# Patient Record
Sex: Female | Born: 1990 | Race: Black or African American | Hispanic: No | Marital: Single | Smoking: Never smoker
Health system: Southern US, Community
[De-identification: ages and names within clinical notes are randomized; demographics above are authoritative.]

## PROBLEM LIST (undated history)

## (undated) DIAGNOSIS — J45909 Unspecified asthma, uncomplicated: Secondary | ICD-10-CM

## (undated) DIAGNOSIS — L309 Dermatitis, unspecified: Secondary | ICD-10-CM

## (undated) HISTORY — PX: TONSILLECTOMY: SUR1361

## (undated) HISTORY — DX: Dermatitis, unspecified: L30.9

---

## 2010-07-26 ENCOUNTER — Emergency Department (HOSPITAL_BASED_OUTPATIENT_CLINIC_OR_DEPARTMENT_OTHER)
Admission: EM | Admit: 2010-07-26 | Discharge: 2010-07-26 | Disposition: A | Discharge status: Home / Self Care | Payer: Self-pay | Attending: Emergency Medicine | Admitting: Emergency Medicine

## 2010-07-26 DIAGNOSIS — N390 Urinary tract infection, site not specified: Secondary | ICD-10-CM | POA: Insufficient documentation

## 2010-07-26 DIAGNOSIS — R319 Hematuria, unspecified: Secondary | ICD-10-CM | POA: Insufficient documentation

## 2010-07-26 LAB — URINALYSIS, ROUTINE W REFLEX MICROSCOPIC
Bilirubin Urine: NEGATIVE
Nitrite: NEGATIVE
Specific Gravity, Urine: 1.027 (ref 1.005–1.030)
Urobilinogen, UA: 0.2 mg/dL (ref 0.0–1.0)

## 2010-07-26 LAB — PREGNANCY, URINE: Preg Test, Ur: NEGATIVE

## 2010-07-26 LAB — URINE MICROSCOPIC-ADD ON

## 2010-07-29 LAB — URINE CULTURE
Colony Count: >=100000
Culture  Setup Time: 201206260142

## 2013-10-15 ENCOUNTER — Emergency Department (HOSPITAL_BASED_OUTPATIENT_CLINIC_OR_DEPARTMENT_OTHER)
Admission: EM | Admit: 2013-10-15 | Discharge: 2013-10-15 | Disposition: A | Discharge status: Home / Self Care | Payer: Self-pay | Attending: Emergency Medicine | Admitting: Emergency Medicine

## 2013-10-15 ENCOUNTER — Encounter (HOSPITAL_BASED_OUTPATIENT_CLINIC_OR_DEPARTMENT_OTHER): Payer: Self-pay | Admitting: Emergency Medicine

## 2013-10-15 DIAGNOSIS — R350 Frequency of micturition: Secondary | ICD-10-CM | POA: Insufficient documentation

## 2013-10-15 DIAGNOSIS — R300 Dysuria: Secondary | ICD-10-CM | POA: Insufficient documentation

## 2013-10-15 DIAGNOSIS — R3915 Urgency of urination: Secondary | ICD-10-CM | POA: Insufficient documentation

## 2013-10-15 DIAGNOSIS — Z3202 Encounter for pregnancy test, result negative: Secondary | ICD-10-CM | POA: Insufficient documentation

## 2013-10-15 LAB — URINALYSIS, ROUTINE W REFLEX MICROSCOPIC
Bilirubin Urine: NEGATIVE
GLUCOSE, UA: NEGATIVE mg/dL
HGB URINE DIPSTICK: NEGATIVE
Ketones, ur: NEGATIVE mg/dL
LEUKOCYTES UA: NEGATIVE
Nitrite: NEGATIVE
Protein, ur: NEGATIVE mg/dL
SPECIFIC GRAVITY, URINE: 1.034 — AB (ref 1.005–1.030)
UROBILINOGEN UA: 1 mg/dL (ref 0.0–1.0)
pH: 6 (ref 5.0–8.0)

## 2013-10-15 LAB — PREGNANCY, URINE: PREG TEST UR: NEGATIVE

## 2013-10-15 MED ORDER — PHENAZOPYRIDINE HCL 200 MG PO TABS: 200.0000 mg | ORAL_TABLET | Freq: Three times a day (TID) | ORAL | Status: DC

## 2013-10-15 MED ORDER — SULFAMETHOXAZOLE-TRIMETHOPRIM 800-160 MG PO TABS: 2.0000 | ORAL_TABLET | Freq: Two times a day (BID) | ORAL | Status: DC

## 2013-10-15 NOTE — ED Notes (Addendum)
Urinary frequency. Dysuria last night. She was started on antibiotics for UTI on Friday.

## 2013-10-15 NOTE — ED Provider Notes (Signed)
CSN: [id number]     Arrival date & time 10/15/13  1922 History  This chart was scribed for Theresa Chick, MD by Roxy Cedar, ED Scribe. This patient was seen in room MH01/MH01 and the patient's care was started at 9:57 PM.  Chief Complaint  Patient presents with  . Urinary Tract Infection   Patient is a 23 y.o. female presenting with urinary tract infection. The history is provided by the patient. No language interpreter was used.  Urinary Tract Infection This is a recurrent problem. The current episode started yesterday. The problem occurs constantly. The problem has been gradually worsening. Pertinent negatives include no chest pain, no abdominal pain, no headaches and no shortness of breath. Exacerbated by: bactrim antibiotic. Nothing relieves the symptoms. Treatments tried: bactrim antibiotic.   HPI Comments: Carlton Jon is a 23 y.o. female who presents to the Emergency Department complaining of urinary frequency, urgency and dysuria that began last night. Patient states that she was taking Bactrim antibiotic 2 tablets BID x 3 days. She states that she has completed her dose and that her symptoms have worsened since she started taking the antibiotic medication. Patient denies associated fever or vomiting.  No abdominal pain,  No vaginal discharge.  She states that 3 days ago she had pelvic exam and complete STD screen as well.    History reviewed. No pertinent past medical history. Past Surgical History  Procedure Laterality Date  . Tonsillectomy     No family history on file. History  Substance Use Topics  . Smoking status: Never Smoker   . Smokeless tobacco: Not on file  . Alcohol Use: No   OB History   Grav Para Term Preterm Abortions TAB SAB Ect Mult Living                 Review of Systems  Constitutional: Negative for fever.  Respiratory: Negative for shortness of breath.   Cardiovascular: Negative for chest pain.  Gastrointestinal: Negative for vomiting and  abdominal pain.  Genitourinary: Positive for dysuria, urgency and frequency.  Neurological: Negative for headaches.  All other systems reviewed and are negative.  Allergies  Review of patient's allergies indicates no known allergies.  Home Medications   Prior to Admission medications   Medication Sig Start Date End Date Taking? Authorizing Provider  phenazopyridine (PYRIDIUM) 200 MG tablet Take 1 tablet (200 mg total) by mouth 3 (three) times daily. 10/15/13   Theresa Chick, MD  sulfamethoxazole-trimethoprim (SEPTRA DS) 800-160 MG per tablet Take 2 tablets by mouth 2 (two) times daily. 10/15/13   Theresa Chick, MD   Triage Vitals: BP 111/79  Pulse 89  Temp(Src) 98.9 F (37.2 C) (Oral)  Resp 20  Ht  (1.626 m)  Wt 147 lb (66.679 kg)  BMI 25.22 kg/m2  SpO2 100%  LMP 08/14/2013  Physical Exam  Nursing note and vitals reviewed. Constitutional: She is oriented to person, place, and time. She appears well-developed and well-nourished. No distress.  HENT:  Head: Normocephalic and atraumatic.  Eyes: Conjunctivae and EOM are normal.  Neck: Neck supple. No tracheal deviation present.  Cardiovascular: Normal rate.   Pulmonary/Chest: Effort normal. No respiratory distress.  Abdominal: Soft. She exhibits no distension. There is no tenderness.  Musculoskeletal: Normal range of motion.  Neurological: She is alert and oriented to person, place, and time.  Skin: Skin is warm and dry.  Psychiatric: She has a normal mood and affect. Her behavior is normal.  Note- abdomen soft, nabs, MMM  ED Course  Procedures (including critical care time)  DIAGNOSTIC STUDIES: Oxygen Saturation is 100% on RA, normal by my interpretation.    COORDINATION OF CARE: 10:02 PM- Discussed plans to order diagnostic lab work. Will give patient Septra and Pyridium and will discharge. Pt advised of plan for treatment and pt agrees.  Labs Review Labs Reviewed  URINALYSIS, ROUTINE W REFLEX MICROSCOPIC -  Abnormal; Notable for the following:    Specific Gravity, Urine 1.034 (*)    All other components within normal limits  URINE CULTURE  PREGNANCY, URINE   Imaging Review No results found.   EKG Interpretation None     MDM   Final diagnoses:  Dysuria    Pt presenting with continued dysuria- finished 3 days of bactrim.  Urinalysis is reassuring. Abdomen is nontender.  Pt states she had STD screen perfomed several days ago.  Will continue on bactrim as urine seems to have cleared, given rx for pyridium for continued symptoms.  Urine culture sent as well.  No abdominal or pelvic pain to suggest PID or other acute emergent condition.  Discharged with strict return precautions.  Pt agreeable with plan.  I personally performed the services described in this documentation, which was scribed in my presence. The recorded information has been reviewed and is accurate.  Nursing notes including past medical history and social history reviewed and considered in documentation Prior records reviewed and considered during this visit     Theresa Chick, MD 10/15/13 2307

## 2014-09-12 ENCOUNTER — Encounter (HOSPITAL_BASED_OUTPATIENT_CLINIC_OR_DEPARTMENT_OTHER): Payer: Self-pay | Admitting: *Deleted

## 2014-09-12 ENCOUNTER — Emergency Department (HOSPITAL_BASED_OUTPATIENT_CLINIC_OR_DEPARTMENT_OTHER)
Admission: EM | Admit: 2014-09-12 | Discharge: 2014-09-12 | Disposition: A | Discharge status: Home / Self Care | Payer: BLUE CROSS/BLUE SHIELD | Attending: Emergency Medicine | Admitting: Emergency Medicine

## 2014-09-12 DIAGNOSIS — N760 Acute vaginitis: Secondary | ICD-10-CM | POA: Insufficient documentation

## 2014-09-12 DIAGNOSIS — Z3202 Encounter for pregnancy test, result negative: Secondary | ICD-10-CM | POA: Diagnosis not present

## 2014-09-12 DIAGNOSIS — Z79899 Other long term (current) drug therapy: Secondary | ICD-10-CM | POA: Insufficient documentation

## 2014-09-12 DIAGNOSIS — B9689 Other specified bacterial agents as the cause of diseases classified elsewhere: Secondary | ICD-10-CM

## 2014-09-12 DIAGNOSIS — Z792 Long term (current) use of antibiotics: Secondary | ICD-10-CM | POA: Insufficient documentation

## 2014-09-12 DIAGNOSIS — R102 Pelvic and perineal pain: Secondary | ICD-10-CM

## 2014-09-12 LAB — URINALYSIS, ROUTINE W REFLEX MICROSCOPIC
BILIRUBIN URINE: NEGATIVE
Glucose, UA: NEGATIVE mg/dL
Hgb urine dipstick: NEGATIVE
KETONES UR: NEGATIVE mg/dL
NITRITE: NEGATIVE
PROTEIN: NEGATIVE mg/dL
Specific Gravity, Urine: 1.036 — ABNORMAL HIGH (ref 1.005–1.030)
UROBILINOGEN UA: 1 mg/dL (ref 0.0–1.0)
pH: 6 (ref 5.0–8.0)

## 2014-09-12 LAB — WET PREP, GENITAL
Trich, Wet Prep: NONE SEEN
Yeast Wet Prep HPF POC: NONE SEEN

## 2014-09-12 LAB — PREGNANCY, URINE: Preg Test, Ur: NEGATIVE

## 2014-09-12 LAB — URINE MICROSCOPIC-ADD ON

## 2014-09-12 MED ORDER — METRONIDAZOLE 500 MG PO TABS: 500.0000 mg | ORAL_TABLET | Freq: Two times a day (BID) | ORAL | Status: DC

## 2014-09-12 MED ORDER — NAPROXEN 375 MG PO TABS: 375.0000 mg | ORAL_TABLET | Freq: Two times a day (BID) | ORAL | Status: DC

## 2014-09-12 NOTE — ED Provider Notes (Signed)
CSN: [id number]     Arrival date & time 09/12/14  1708 History   First MD Initiated Contact with Patient 09/12/14 1833     Chief Complaint  Patient presents with  . Abdominal Pain     (Consider location/radiation/quality/duration/timing/severity/associated sxs/prior Treatment) HPI   Theresa Le The 24 year old female who presents emergency Department with chief complaint of pelvic pain. Patient states she's had 4 days of worsening crampy pelvic abdominal pain. She does have associated clear vaginal discharge without foul odor. She denies urinary symptoms, fever, constipation. Patient's last menstrual period was last month. However, she states her periods are very irregular. When she does have them. She has severe cramps and heavy bleeding. Patient is sexually active with a single female partner.   History reviewed. No pertinent past medical history. Past Surgical History  Procedure Laterality Date  . Tonsillectomy     No family history on file. Social History  Substance Use Topics  . Smoking status: Never Smoker   . Smokeless tobacco: None  . Alcohol Use: No   OB History    No data available     Review of Systems  Ten systems reviewed and are negative for acute change, except as noted in the HPI.    Allergies  Review of patient's allergies indicates no known allergies.  Home Medications   Prior to Admission medications   Medication Sig Start Date End Date Taking? Authorizing Provider  phenazopyridine (PYRIDIUM) 200 MG tablet Take 1 tablet (200 mg total) by mouth 3 (three) times daily. 10/15/13   Jerelyn Scott, MD  sulfamethoxazole-trimethoprim (SEPTRA DS) 800-160 MG per tablet Take 2 tablets by mouth 2 (two) times daily. 10/15/13   Jerelyn Scott, MD   BP 115/64 mmHg  Pulse 88  Temp(Src) 98.5 F (36.9 C) (Oral)  Resp 16  Ht 5\' 6"  (1.676 m)  Wt 151 lb (68.493 kg)  BMI 24.38 kg/m2  SpO2 100%  LMP 08/06/2014 Physical Exam  Constitutional: She is oriented to person,  place, and time. She appears well-developed and well-nourished. No distress.  HENT:  Head: Normocephalic and atraumatic.  Eyes: Conjunctivae are normal. No scleral icterus.  Neck: Normal range of motion.  Cardiovascular: Normal rate, regular rhythm and normal heart sounds.  Exam reveals no gallop and no friction rub.   No murmur heard. Pulmonary/Chest: Effort normal and breath sounds normal. No respiratory distress.  Abdominal: Soft. Bowel sounds are normal. She exhibits no distension and no mass. There is no tenderness. There is no guarding.  Genitourinary:  Pelvic exam: normal external genitalia, vulva, vagina, cervix, uterus and adnexa. Diffuse tenderness throughout exam. No adnexal tenderness, specifically no cervical motion tenderness, specifically   Neurological: She is alert and oriented to person, place, and time.  Skin: Skin is warm and dry. She is not diaphoretic.  Nursing note and vitals reviewed.   ED Course  Procedures (including critical care time) Labs Review Labs Reviewed  URINALYSIS, ROUTINE W REFLEX MICROSCOPIC (NOT AT [facility]) - Abnormal; Notable for the following:    APPearance CLOUDY (*)    Specific Gravity, Urine 1.036 (*)    Leukocytes, UA SMALL (*)    All other components within normal limits  URINE MICROSCOPIC-ADD ON - Abnormal; Notable for the following:    Squamous Epithelial / LPF FEW (*)    All other components within normal limits  PREGNANCY, URINE    Imaging Review No results found. Lucila Maine, personally reviewed and evaluated these images and lab results as part of my  medical decision-making.   EKG Interpretation None      MDM   Final diagnoses:  None    Patient here with pelvic pain. Urine does not appear to be infected. She has a history of previous urinary tract infections.  Wet prep shows many clue cells and white or red cells.  Patient be treated for bacterial vaginosis. I doubt any other cause of her pain. She is to  follow-up with the [facility] outpatient clinic. Otherwise, she appears well. He is to return precautions.    Arthor Captain, PA-C 09/12/14 1955  Doug Sou, MD 09/12/14 [phone]

## 2014-09-12 NOTE — ED Notes (Signed)
Abdominal pain x 3 days

## 2014-09-12 NOTE — Discharge Instructions (Signed)
Do not drink alcohol with this medication  Pelvic Pain Female pelvic pain can be caused by many different things and start from a variety of places. Pelvic pain refers to pain that is located in the lower half of the abdomen and between your hips. The pain may occur over a short period of time (acute) or may be reoccurring (chronic). The cause of pelvic pain may be related to disorders affecting the female reproductive organs (gynecologic), but it may also be related to the bladder, kidney stones, an intestinal complication, or muscle or skeletal problems. Getting help right away for pelvic pain is important, especially if there has been severe, sharp, or a sudden onset of unusual pain. It is also important to get help right away because some types of pelvic pain can be life threatening.  CAUSES  Below are only some of the causes of pelvic pain. The causes of pelvic pain can be in one of several categories.   Gynecologic.  Pelvic inflammatory disease.  Sexually transmitted infection.  Ovarian cyst or a twisted ovarian ligament (ovarian torsion).  Uterine lining that grows outside the uterus (endometriosis).  Fibroids, cysts, or tumors.  Ovulation.  Pregnancy.  Pregnancy that occurs outside the uterus (ectopic pregnancy).  Miscarriage.  Labor.  Abruption of the placenta or ruptured uterus.  Infection.  Uterine infection (endometritis).  Bladder infection.  Diverticulitis.  Miscarriage related to a uterine infection (septic abortion).  Bladder.  Inflammation of the bladder (cystitis).  Kidney stone(s).  Gastrointestinal.  Constipation.  Diverticulitis.  Neurologic.  Trauma.  Feeling pelvic pain because of mental or emotional causes (psychosomatic).  Cancers of the bowel or pelvis. EVALUATION  Your caregiver will want to take a careful history of your concerns. This includes recent changes in your health, a careful gynecologic history of your periods  (menses), and a sexual history. Obtaining your family history and medical history is also important. Your caregiver may suggest a pelvic exam. A pelvic exam will help identify the location and severity of the pain. It also helps in the evaluation of which organ system may be involved. In order to identify the cause of the pelvic pain and be properly treated, your caregiver may order tests. These tests may include:   A pregnancy test.  Pelvic ultrasonography.  An X-ray exam of the abdomen.  A urinalysis or evaluation of vaginal discharge.  Blood tests. HOME CARE INSTRUCTIONS   Only take over-the-counter or prescription medicines for pain, discomfort, or fever as directed by your caregiver.   Rest as directed by your caregiver.   Eat a balanced diet.   Drink enough fluids to make your urine clear or pale yellow, or as directed.   Avoid sexual intercourse if it causes pain.   Apply warm or cold compresses to the lower abdomen depending on which one helps the pain.   Avoid stressful situations.   Keep a journal of your pelvic pain. Write down when it started, where the pain is located, and if there are things that seem to be associated with the pain, such as food or your menstrual cycle.  Follow up with your caregiver as directed.  SEEK MEDICAL CARE IF:  Your medicine does not help your pain.  You have abnormal vaginal discharge. SEEK IMMEDIATE MEDICAL CARE IF:   You have heavy bleeding from the vagina.   Your pelvic pain increases.   You feel light-headed or faint.   You have chills.   You have pain with urination or blood in  your urine.   You have uncontrolled diarrhea or vomiting.   You have a fever or persistent symptoms for more than 3 days.  You have a fever and your symptoms suddenly get worse.   You are being physically or sexually abused.  MAKE SURE YOU:  Understand these instructions.  Will watch your condition.  Will get help if you  are not doing well or get worse. Document Released: 12/15/2003 Document Revised: 06/03/2013 Document Reviewed: 05/09/2011 [facility] Patient Information 2015 [place], [place]. This information is not intended to replace advice given to you by your health care provider. Make sure you discuss any questions you have with your health care provider.  Bacterial Vaginosis Bacterial vaginosis is a vaginal infection that occurs when the normal balance of bacteria in the vagina is disrupted. It results from an overgrowth of certain bacteria. This is the most common vaginal infection in women of childbearing age. Treatment is important to prevent complications, especially in pregnant women, as it can cause a premature delivery. CAUSES  Bacterial vaginosis is caused by an increase in harmful bacteria that are normally present in smaller amounts in the vagina. Several different kinds of bacteria can cause bacterial vaginosis. However, the reason that the condition develops is not fully understood. RISK FACTORS Certain activities or behaviors can put you at an increased risk of developing bacterial vaginosis, including:  Having a new sex partner or multiple sex partners.  Douching.  Using an intrauterine device (IUD) for contraception. Women do not get bacterial vaginosis from toilet seats, bedding, swimming pools, or contact with objects around them. SIGNS AND SYMPTOMS  Some women with bacterial vaginosis have no signs or symptoms. Common symptoms include:  Grey vaginal discharge.  A fishlike odor with discharge, especially after sexual intercourse.  Itching or burning of the vagina and vulva.  Burning or pain with urination. DIAGNOSIS  Your health care provider will take a medical history and examine the vagina for signs of bacterial vaginosis. A sample of vaginal fluid may be taken. Your health care provider will look at this sample under a microscope to check for bacteria and abnormal cells. A vaginal  pH test may also be done.  TREATMENT  Bacterial vaginosis may be treated with antibiotic medicines. These may be given in the form of a pill or a vaginal cream. A second round of antibiotics may be prescribed if the condition comes back after treatment.  HOME CARE INSTRUCTIONS   Only take over-the-counter or prescription medicines as directed by your health care provider.  If antibiotic medicine was prescribed, take it as directed. Make sure you finish it even if you start to feel better.  Do not have sex until treatment is completed.  Tell all sexual partners that you have a vaginal infection. They should see their health care provider and be treated if they have problems, such as a mild rash or itching.  Practice safe sex by using condoms and only having one sex partner. SEEK MEDICAL CARE IF:   Your symptoms are not improving after 3 days of treatment.  You have increased discharge or pain.  You have a fever. MAKE SURE YOU:   Understand these instructions.  Will watch your condition.  Will get help right away if you are not doing well or get worse. FOR MORE INFORMATION  Centers for Disease Control and Prevention, Division of STD Prevention: [employer] American Sexual Health Association (ASHA): www.ashastd.org  Document Released: 01/17/2005 Document Revised: 11/07/2012 Document Reviewed: 08/29/2012 [employer] Patient Information 2015  [employer]. This information is not intended to replace advice given to you by your health care provider. Make sure you discuss any questions you have with your health care provider.

## 2014-09-14 LAB — RPR: RPR: NONREACTIVE

## 2014-09-14 LAB — HIV ANTIBODY (ROUTINE TESTING W REFLEX): HIV SCREEN 4TH GENERATION: NONREACTIVE

## 2014-09-15 LAB — GC/CHLAMYDIA PROBE AMP (~~LOC~~) NOT AT ARMC
Chlamydia: NEGATIVE
NEISSERIA GONORRHEA: NEGATIVE

## 2015-07-21 ENCOUNTER — Encounter (HOSPITAL_BASED_OUTPATIENT_CLINIC_OR_DEPARTMENT_OTHER): Payer: Self-pay | Admitting: *Deleted

## 2015-07-21 ENCOUNTER — Other Ambulatory Visit: Payer: Self-pay

## 2015-07-21 ENCOUNTER — Emergency Department (HOSPITAL_BASED_OUTPATIENT_CLINIC_OR_DEPARTMENT_OTHER): Payer: BLUE CROSS/BLUE SHIELD

## 2015-07-21 ENCOUNTER — Emergency Department (HOSPITAL_BASED_OUTPATIENT_CLINIC_OR_DEPARTMENT_OTHER)
Admission: EM | Admit: 2015-07-21 | Discharge: 2015-07-21 | Disposition: A | Discharge status: Home / Self Care | Payer: BLUE CROSS/BLUE SHIELD | Attending: Emergency Medicine | Admitting: Emergency Medicine

## 2015-07-21 DIAGNOSIS — R0602 Shortness of breath: Secondary | ICD-10-CM | POA: Diagnosis present

## 2015-07-21 DIAGNOSIS — J4520 Mild intermittent asthma, uncomplicated: Secondary | ICD-10-CM | POA: Insufficient documentation

## 2015-07-21 DIAGNOSIS — R0600 Dyspnea, unspecified: Secondary | ICD-10-CM

## 2015-07-21 HISTORY — DX: Unspecified asthma, uncomplicated: J45.909

## 2015-07-21 LAB — PREGNANCY, URINE: PREG TEST UR: NEGATIVE

## 2015-07-21 MED ORDER — ALBUTEROL SULFATE HFA 108 (90 BASE) MCG/ACT IN AERS
4.0000 | INHALATION_SPRAY | Freq: Once | RESPIRATORY_TRACT | Status: AC
  Administered 2015-07-21: 4 via RESPIRATORY_TRACT
  Filled 2015-07-21: qty 6.7

## 2015-07-21 MED ORDER — DEXAMETHASONE 6 MG PO TABS
ORAL_TABLET | ORAL | Status: AC
  Filled 2015-07-21: qty 1

## 2015-07-21 MED ORDER — DEXAMETHASONE 6 MG PO TABS
16.0000 mg | ORAL_TABLET | Freq: Once | ORAL | Status: AC
  Administered 2015-07-21: 16 mg via ORAL
  Filled 2015-07-21: qty 1

## 2015-07-21 NOTE — Discharge Instructions (Signed)
Continue to follow-up with your primary care provider regarding further management. Return without fail for worsening symptoms, including fever, severe chest pain, passing out, difficulty breathing, or any other symptoms concerning to you.  Asthma, Adult Asthma is a recurring condition in which the airways tighten and narrow. Asthma can make it difficult to breathe. It can cause coughing, wheezing, and shortness of breath. Asthma episodes, also called asthma attacks, range from minor to life-threatening. Asthma cannot be cured, but medicines and lifestyle changes can help control it. CAUSES Asthma is believed to be caused by inherited (genetic) and environmental factors, but its exact cause is unknown. Asthma may be triggered by allergens, lung infections, or irritants in the air. Asthma triggers are different for each person. Common triggers include:   Animal dander.  Dust mites.  Cockroaches.  Pollen from trees or grass.  Mold.  Smoke.  Air pollutants such as dust, household cleaners, hair sprays, aerosol sprays, paint fumes, strong chemicals, or strong odors.  Cold air, weather changes, and winds (which increase molds and pollens in the air).  Strong emotional expressions such as crying or laughing hard.  Stress.  Certain medicines (such as aspirin) or types of drugs (such as beta-blockers).  Sulfites in foods and drinks. Foods and drinks that may contain sulfites include dried fruit, potato chips, and sparkling grape juice.  Infections or inflammatory conditions such as the flu, a cold, or an inflammation of the nasal membranes (rhinitis).  Gastroesophageal reflux disease (GERD).  Exercise or strenuous activity. SYMPTOMS Symptoms may occur immediately after asthma is triggered or many hours later. Symptoms include:  Wheezing.  Excessive nighttime or early morning coughing.  Frequent or severe coughing with a common cold.  Chest tightness.  Shortness of  breath. DIAGNOSIS  The diagnosis of asthma is made by a review of your medical history and a physical exam. Tests may also be performed. These may include:  Lung function studies. These tests show how much air you breathe in and out.  Allergy tests.  Imaging tests such as X-rays. TREATMENT  Asthma cannot be cured, but it can usually be controlled. Treatment involves identifying and avoiding your asthma triggers. It also involves medicines. There are 2 classes of medicine used for asthma treatment:   Controller medicines. These prevent asthma symptoms from occurring. They are usually taken every day.  Reliever or rescue medicines. These quickly relieve asthma symptoms. They are used as needed and provide short-term relief. Your health care provider will help you create an asthma action plan. An asthma action plan is a written plan for managing and treating your asthma attacks. It includes a list of your asthma triggers and how they may be avoided. It also includes information on when medicines should be taken and when their dosage should be changed. An action plan may also involve the use of a device called a peak flow meter. A peak flow meter measures how well the lungs are working. It helps you monitor your condition. HOME CARE INSTRUCTIONS   Take medicines only as directed by your health care provider. Speak with your health care provider if you have questions about how or when to take the medicines.  Use a peak flow meter as directed by your health care provider. Record and keep track of readings.  Understand and use the action plan to help minimize or stop an asthma attack without needing to seek medical care.  Control your home environment in the following ways to help prevent asthma attacks:  Do  not smoke. Avoid being exposed to secondhand smoke.  Change your heating and air conditioning filter regularly.  Limit your use of fireplaces and wood stoves.  Get rid of pests (such as  roaches and mice) and their droppings.  Throw away plants if you see mold on them.  Clean your floors and dust regularly. Use unscented cleaning products.  Try to have someone else vacuum for you regularly. Stay out of rooms while they are being vacuumed and for a short while afterward. If you vacuum, use a dust mask from a hardware store, a double-layered or microfilter vacuum cleaner bag, or a vacuum cleaner with a HEPA filter.  Replace carpet with wood, tile, or vinyl flooring. Carpet can trap dander and dust.  Use allergy-proof pillows, mattress covers, and box spring covers.  Wash bed sheets and blankets every week in hot water and dry them in a dryer.  Use blankets that are made of polyester or cotton.  Clean bathrooms and kitchens with bleach. If possible, have someone repaint the walls in these rooms with mold-resistant paint. Keep out of the rooms that are being cleaned and painted.  Wash hands frequently. SEEK MEDICAL CARE IF:   You have wheezing, shortness of breath, or a cough even if taking medicine to prevent attacks.  The colored mucus you cough up (sputum) is thicker than usual.  Your sputum changes from clear or white to yellow, green, gray, or bloody.  You have any problems that may be related to the medicines you are taking (such as a rash, itching, swelling, or trouble breathing).  You are using a reliever medicine more than 2-3 times per week.  Your peak flow is still at 50-79% of your personal best after following your action plan for 1 hour.  You have a fever. SEEK IMMEDIATE MEDICAL CARE IF:   You seem to be getting worse and are unresponsive to treatment during an asthma attack.  You are short of breath even at rest.  You get short of breath when doing very little physical activity.  You have difficulty eating, drinking, or talking due to asthma symptoms.  You develop chest pain.  You develop a fast heartbeat.  You have a bluish color to your  lips or fingernails.  You are light-headed, dizzy, or faint.  Your peak flow is less than 50% of your personal best.   This information is not intended to replace advice given to you by your health care provider. Make sure you discuss any questions you have with your health care provider.   Document Released: 01/17/2005 Document Revised: 10/08/2014 Document Reviewed: 08/16/2012 [employer] Interactive Patient Education [employer]

## 2015-07-21 NOTE — ED Provider Notes (Signed)
CSN: [id number]     Arrival date & time 07/21/15  1036 History   First MD Initiated Contact with Patient 07/21/15 1043     Chief Complaint  Patient presents with  . Asthma     (Consider location/radiation/quality/duration/timing/severity/associated sxs/prior Treatment) HPI 25 year old female who presents with shortness of breath. History of asthma, but well controlled. States hot weather and dust will flare up her asthma, and for the past day has had difficulty breathing and chest tightness. No fever, cough, congestion, sore throat or runny nose. No leg swelling or leg pain. No syncope or near syncope. Does not have an inhaler, ran out some time ago. Unsure of when last exacerbation was that required steroids. States symptoms usually flare up at night, but she tries to just deal with it herself. No history of PE/DVT or strong family history. No OCPs. No recent immbilization.   Past Medical History  Diagnosis Date  . Asthma    Past Surgical History  Procedure Laterality Date  . Tonsillectomy     History reviewed. No pertinent family history. Social History  Substance Use Topics  . Smoking status: Never Smoker   . Smokeless tobacco: None  . Alcohol Use: No   OB History    No data available     Review of Systems 10/14 systems reviewed and are negative other than those stated in the HPI    Allergies  Review of patient's allergies indicates no known allergies.  Home Medications   Prior to Admission medications   Medication Sig Start Date End Date Taking? Authorizing Provider  metroNIDAZOLE (FLAGYL) 500 MG tablet Take 1 tablet (500 mg total) by mouth 2 (two) times daily. One po bid x 7 days 09/12/14   Arthor Captain, PA-C  naproxen (NAPROSYN) 375 MG tablet Take 1 tablet (375 mg total) by mouth 2 (two) times daily. 09/12/14   Arthor Captain, PA-C  phenazopyridine (PYRIDIUM) 200 MG tablet Take 1 tablet (200 mg total) by mouth 3 (three) times daily. 10/15/13   Jerelyn Scott, MD    BP 127/74 mmHg  Pulse 80  Temp(Src) 98.2 F (36.8 C) (Oral)  Resp 18  Ht  (1.676 m)  Wt 154 lb (69.854 kg)  BMI 24.87 kg/m2  SpO2 100%  LMP 07/14/2015 Physical Exam Physical Exam  Nursing note and vitals reviewed. Constitutional: Well developed, well nourished, non-toxic, and in no acute distress Head: Normocephalic and atraumatic.  Mouth/Throat: Oropharynx is clear and moist.  Neck: Normal range of motion. Neck supple.  Cardiovascular: Normal rate and regular rhythm.  No LE edema. Pulmonary/Chest: Effort normal and breath sounds normal.  Abdominal: Soft. There is no tenderness. There is no rebound and no guarding.  Musculoskeletal: Normal range of motion.  Neurological: Alert, no facial droop, fluent speech, moves all extremities symmetrically Skin: Skin is warm and dry.  Psychiatric: Cooperative  ED Course  Procedures (including critical care time) Labs Review Labs Reviewed  PREGNANCY, URINE    Imaging Review Dg Chest 2 View  07/21/2015  CLINICAL DATA:  Mid chest tightness. Shortness of breath. Duration: 1 day. EXAM: CHEST  2 VIEW COMPARISON:  None. FINDINGS: The heart size and mediastinal contours are within normal limits. Both lungs are clear. The visualized skeletal structures are unremarkable. IMPRESSION: No active cardiopulmonary disease. Electronically Signed   By: Gaylyn Rong M.D.   On: 07/21/2015 11:40   I have personally reviewed and evaluated these images and lab results as part of my medical decision-making.   EKG  Interpretation   Date/Time:  Tuesday July 21 2015 11:09:47 EDT Ventricular Rate:  70 PR Interval:    QRS Duration: 84 QT Interval:  371 QTC Calculation: 401 R Axis:   23 Text Interpretation:  Sinus rhythm RSR' in V1 or V2, right VCD or RVH No  acute ischemic changes. No prior EKG for comparison  Confirmed by Shundra Wirsing MD,  Savino Whisenant [phone]([id number]) on 07/21/2015 11:22:56 AM      MDM   Final diagnoses:  Dyspnea  Asthma, mild intermittent,  uncomplicated    25 year old female with history of asthma who presents with shortness of breath consistent with her asthma flareup. She is well-appearing in no acute distress. Speaking in full sentences, with normal work of breathing and normal oxygenation. No significant wheezing noted on exam, but she says still feeling mild shortness of breath and chest tightness although not as severe as yesterday when she was exposed to dust and heat. She appears euvolemic. Chest x-ray shows no acute cardiopulmonary processes. EKG without significant heart strain or ischemic changes. She did receive 4 puffs albuterol, and symptomatically feels improved. Given single dose of decadron. No suspicion for PE  (PERC negative) or other acute cardiac or intrathoracic processes at this time. Strict return and follow-up instructions reviewed. She expressed understanding of all discharge instructions and felt comfortable with the plan of care.     Lavera Guiseana Duo Marcene Laskowski, MD 07/21/15 [phone]

## 2015-07-21 NOTE — ED Notes (Signed)
Pt here for sob.  Pt reports that she has these symptoms often and this time the sob began yesterday.  No distress noted.  Pt denies recent illness.  No URI.  No fever or chills. Pt has asthma but no inhaler at home, pt states that she thinks this is an asthma exacerbation.

## 2017-02-23 HISTORY — PX: OTHER SURGICAL HISTORY: SHX169

## 2017-06-08 ENCOUNTER — Ambulatory Visit (INDEPENDENT_AMBULATORY_CARE_PROVIDER_SITE_OTHER): Payer: BLUE CROSS/BLUE SHIELD | Admitting: Allergy & Immunology

## 2017-06-08 ENCOUNTER — Telehealth: Payer: Self-pay

## 2017-06-08 ENCOUNTER — Encounter: Payer: Self-pay | Admitting: Allergy & Immunology

## 2017-06-08 VITALS — BP 100/58 | HR 84 | Temp 98.2°F | Resp 16 | Ht 66.0 in | Wt 148.0 lb

## 2017-06-08 DIAGNOSIS — J310 Chronic rhinitis: Secondary | ICD-10-CM | POA: Diagnosis not present

## 2017-06-08 DIAGNOSIS — J4540 Moderate persistent asthma, uncomplicated: Secondary | ICD-10-CM | POA: Diagnosis not present

## 2017-06-08 DIAGNOSIS — T781XXD Other adverse food reactions, not elsewhere classified, subsequent encounter: Secondary | ICD-10-CM | POA: Diagnosis not present

## 2017-06-08 DIAGNOSIS — L2084 Intrinsic (allergic) eczema: Secondary | ICD-10-CM | POA: Insufficient documentation

## 2017-06-08 MED ORDER — ALBUTEROL SULFATE HFA 108 (90 BASE) MCG/ACT IN AERS: 2.0000 | INHALATION_SPRAY | RESPIRATORY_TRACT | 2 refills | Status: DC | PRN

## 2017-06-08 MED ORDER — CRISABOROLE 2 % EX OINT: 1.0000 "application " | TOPICAL_OINTMENT | Freq: Two times a day (BID) | CUTANEOUS | 3 refills | Status: DC | PRN

## 2017-06-08 MED ORDER — MONTELUKAST SODIUM 10 MG PO TABS: 10.0000 mg | ORAL_TABLET | Freq: Every day | ORAL | 5 refills | Status: DC

## 2017-06-08 MED ORDER — BUDESONIDE-FORMOTEROL FUMARATE 80-4.5 MCG/ACT IN AERO: 2.0000 | INHALATION_SPRAY | Freq: Two times a day (BID) | RESPIRATORY_TRACT | 5 refills | Status: DC

## 2017-06-08 MED ORDER — TRIAMCINOLONE ACETONIDE 0.1 % EX OINT: TOPICAL_OINTMENT | CUTANEOUS | 3 refills | Status: DC

## 2017-06-08 NOTE — Patient Instructions (Addendum)
1. Moderate persistent asthma, uncomplicated - Lung function looks great today. - We will add on Symbicort (contains a long actinging albuterol with an inhaled steroid. - Spacer sample and demonstration provided. - Daily controller medication(s): Singulair  daily and Symbicort 80/4.40mcg two puffs twice daily with spacer - Prior to physical activity: ProAir 2 puffs 10-15 minutes before physical activity. - Rescue medications: ProAir 4 puffs every 4-6 hours as needed - Asthma control goals:  * Full participation in all desired activities (may need albuterol before activity) * Albuterol use two time or less a week on average (not counting use with activity) * Cough interfering with sleep two time or less a month * Oral steroids no more than once a year * No hospitalizations  2. Chronic rhinitis - We will get [facility] records [facility]. - In the meantime, start Nasacort one spray per nostril daily to help with your nasal inflammation. - We can discuss other treatment options at the next visit once we have all of your outside records.   3. Intrinsic atopic dermatitis - with an episode of urticaria - Start moisturizing twice daily, especially after showering, with one of the moisturizers listed below. - Start a topical steroidal anti-inflammatory (DO NOT use on face): triamcinolone 0.1% ointment twice daily as needed to the worst areas (ideally this would not be used every day). - Start a topical non-steroidal anti-inflammatory (safe for the face as well as the rest of the body: Eucrisa twice daily as needed to the worst areas (ideally this would not be used every day). - We can discuss using Dupixent once you are no longer pregnant.  4. Pollen-food allergy syndrome - Your story seems very consistent with pollen-food syndrome. - This is caused by a cross reactivity between the environmental pollens and the outside of fruits/vegetables/seeds. - Typically these do not result in  anaphylaxis, therefore an EpiPen is not necessarily needed.  5. Return in about 2 months (around 08/08/2017).   Please inform us of any Emergency Department visits, hospitalizations, or changes in symptoms. Call us before going to the ED for breathing or allergy symptoms since we might be able to fit you in for a sick visit. Feel free to contact us anytime with any questions, problems, or concerns.  It was a pleasure to meet you today! Congrats on the baby boy!   Websites that have reliable patient information: 1. American Academy of Asthma, Allergy, and Immunology: www.aaaai.org 2. Food Allergy Research and Education (FARE): foodallergy.org 3. Mothers of Asthmatics: http://www.asthmacommunitynetwork.org 4. American College of Allergy, Asthma, and Immunology: www.acaai.org   ECZEMA SKIN CARE REGIMEN:  Bathed and soak for 10 minutes in warm water once today. Pat dry.  Immediately apply the below creams:  To healthy skin apply Aquaphor, Eucerin, Vanicream, Cerave, or Vaseline jelly twice a day.    To affected areas on the face and neck, apply: Eucrisa 2% ointment twice daily as needed. Be careful to avoid the eyes.   To affected areas on the body (below the face and neck), apply: Triamcinolone 0.1 % ointment twice a day as needed. With ointments, be careful to avoid the armpits and groin area.  Control itching with cetirizine (Zyrtec) daily. You can get generic cetirizine at any drug store or on [employer] for much cheaper than the brand name.   Use soaps and shampoos that are unscented and have the fewest amounts of additives. Some good examples include:      The oral allergy syndrome (OAS) or  pollen-food allergy syndrome (PFAS) is a relatively common form of food allergy, particularly in adults. It typically occurs in people who have pollen allergies when the immune system "sees" proteins on the food that look like proteins on the pollen. This results in the allergy antibody (IgE)  binding to the food instead of the pollen. Patients typically report itching and/or mild swelling of the mouth and throat immediately following ingestion of certain uncooked fruits (including nuts) or raw vegetables. Only a very small number of affected individuals experience systemic allergic reactions, such as anaphylaxis which occurs with true food allergies.

## 2017-06-08 NOTE — Progress Notes (Signed)
NEW PATIENT  Date of Service/Encounter:  06/08/17  Referring provider: System, Pcp Not In   Assessment:   Moderate persistent asthma - complicated by pregnancy (first trimester)  Chronic rhinitis  Intrinsic atopic dermatitis  Pollen-food allergy syndrome   Asthma Reportables:  Severity: moderate persistent  Risk: high Control: not well controlled   Plan/Recommendations:   1. Moderate persistent asthma, uncomplicated - Lung function looks great today. - We will add on Symbicort (contains a long actinging albuterol with an inhaled steroid. - Spacer sample and demonstration provided. - Daily controller medication(s): Singulair  daily and Symbicort 80/4.46mcg two puffs twice daily with spacer - Prior to physical activity: ProAir 2 puffs 10-15 minutes before physical activity. - Rescue medications: ProAir 4 puffs every 4-6 hours as needed - Asthma control goals:  * Full participation in all desired activities (may need albuterol before activity) * Albuterol use two time or less a week on average (not counting use with activity) * Cough interfering with sleep two time or less a month * Oral steroids no more than once a year * No hospitalizations  2. Chronic rhinitis - We will get your records from [facility]. - In the meantime, start Nasacort one spray per nostril daily to help with your nasal inflammation. - We can discuss other treatment options at the next visit once we have all of your outside records.   3. Intrinsic atopic dermatitis - with an episode of ? urticaria - Start moisturizing twice daily, especially after showering, with one of the moisturizers listed below. - Start a topical steroidal anti-inflammatory (DO NOT use on face): triamcinolone 0.1% ointment twice daily as needed to the worst areas (ideally this would not be used every day). - Start a topical non-steroidal anti-inflammatory (safe for the face as well as the rest of the body:  Eucrisa twice daily as needed to the worst areas (ideally this would not be used every day). - We can discuss using Dupixent once you are no longer pregnant.  4. Pollen-food allergy syndrome - Your story seems very consistent with pollen-food syndrome. - This is caused by a cross reactivity between the environmental pollens and the outside of fruits/vegetables/seeds. - Typically these do not result in anaphylaxis, therefore an EpiPen is not necessarily needed.  5. Return in about 2 months (around 08/08/2017).  Subjective:   Theresa Le is a 27 y.o. female presenting today for evaluation of  Chief Complaint  Patient presents with  . Asthma  . Eczema  . Urticaria    last month on neck and arms    Theresa Le has a history of the following: There are no active problems to display for this patient.   History obtained from: chart review and patient.  Theresa Le was referred by System, Pcp Not In.     Theresa Le is a 27 y.o. female presenting for an allergy evaluation. She is pregnant with her second child. She also has a 67 year old at home.   She reports that she had an episode of hives on her neck. It started on her neck and them spread to include her arms. As they would start to go away, the skin would become darker and start to peel. This went on for a month. She was told that it could have been allergies. She was not eating anything out of the ordinary. It would evolve to become more dry and peeling. She was pregnant at the time, but she was not sick at the  time. She had no fever or joint pains during this time. Unfortunately, she has no pictures of this. She has never had a similar reaction in the past.   Theresa Le also reports that she has gum swelling and throat itching with exposure to certain fruits and vegetables. She was tested for foods at [facility] and was diagnosed with food-pollen syndrome; she does not have the results of the testing. This happens with strawberries,  watermelon, and cucumbers. She has a similar reaction to peanuts, which is a new addition to the list. She has never had symptoms extend beyond her oral cavity.   She does have eczema. The hands are particularly bad. She has been on a multitude of topical emollients that do not provide much relief. This still leaves the skin darker. She does not have a prescription ointment for the eczema. She has not needed antibiotics for treatment of the skin infections. 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   She does have asthma. She has ProAir as needed. She also was given a sample of Breo at one point last year. This did provide some help for her symptoms, but she never received a prescription for this. She uses her ProAir as needed, but it is difficult to gauge how often this happens. 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 She has been in this environment for four years. She does not really like her job at all but is sticking around because "it pays the bills".   She did use a nose sprays when she was younger but uses nothing now. She denies NSAID sensitivity. She denies sinus infections.  She denies nasal fullness.  She is able to smell without a problem. She has never been told that she has nasal polyps.   Otherwise, there is no history of other atopic diseases, including asthma, drug allergies, food allergies, environmental allergies, or stinging insect allergies. There is no significant infectious history. Vaccinations are up to date.    Past Medical History: There are no active problems to display for this patient.   Medication List:  Allergies as of 06/08/2017   No Known Allergies     Medication List        Accurate as of 06/08/17  8:32 PM. Always use your most recent med list.          albuterol 108 (90 Base) MCG/ACT  inhaler Commonly known as:  PROAIR HFA Inhale 2 puffs into the lungs every 4 (four) hours as needed for wheezing or shortness of breath.   budesonide-formoterol 80-4.5 MCG/ACT inhaler Commonly known as:  SYMBICORT Inhale 2 puffs into the lungs 2 (two) times daily.   Crisaborole 2 % Oint Commonly known as:  EUCRISA Apply 1 application topically 2 (two) times daily as needed.   diphenhydrAMINE 25 MG tablet Commonly known as:  BENADRYL Take 25 mg by mouth every 6 (six) hours as needed.   montelukast 10 MG tablet Commonly known as:  SINGULAIR Take 1 tablet (10 mg total) by mouth at bedtime.   PRENATAL COMPLETE PO Take by mouth.   triamcinolone ointment 0.1 % Commonly known as:  KENALOG Apply sparingly to affected areas twice daily as needed below face and neck       Birth History: non-contributory.   Developmental History: non-contributory.   Past Surgical  History: Past Surgical History:  Procedure Laterality Date  . removal of fallopian tube  02/23/2017  . TONSILLECTOMY       Family History: Family History  Problem Relation Age of Onset  . Allergic rhinitis Mother   . Eczema Brother   . Eczema Paternal Aunt   . Eczema Daughter   . Asthma Neg Hx   . Urticaria Neg Hx   . Immunodeficiency Neg Hx   . Angioedema Neg Hx      Social History: Theresa Le lives at home with her daughter.  She lives in an apartment.  There is carpeting throughout the apartment.  They have electric heating and central cooling.  There are no animals inside or outside of the home.  She does not have any roaches or mildew problems.  She does not have dust mite covers on her bedding.  There is no tobacco exposure.  Currently she works in a Naval architect for [employer].  She has worked there for 4 years.  She finished school last year.  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  She went to [employer], which went out of business late last year.  She has had trouble finding a job.   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    Review of Systems: a 14-point review of systems is pertinent for what is mentioned in HPI.  Otherwise, all other systems were negative. Constitutional: negative other than that listed in the HPI Eyes: negative other than that listed in the HPI Ears, nose, mouth, throat, and face: negative other than that listed in the HPI Respiratory: negative other than that listed in the HPI Cardiovascular: negative other than that listed in the HPI Gastrointestinal: negative other than that listed in the HPI Genitourinary: negative other than that listed in the HPI Integument: negative other than that listed in the HPI Hematologic: negative other than that listed in the HPI Musculoskeletal: negative other than that listed in the HPI Neurological: negative other than that listed in the HPI Allergy/Immunologic: negative other than that listed in the HPI    Objective:   Blood pressure (!) 100/58, pulse 84, temperature 98.2 F (36.8 C), temperature source Oral, resp. rate 16, height  (1.676 m), weight 148 lb (67.1 kg), SpO2 98 %. Body mass index is 23.89 kg/m.   Physical Exam:  General: Alert, interactive, in no acute distress. Pleasant.  Eyes: No conjunctival injection bilaterally, no discharge on the right, no discharge on the left, no Horner-Trantas dots present and allergic shiners present bilaterally. PERRL bilaterally. EOMI without pain. No photophobia.  Ears: Right TM pearly gray with normal light reflex, Left TM pearly gray with normal light reflex, Right TM intact without perforation and Left TM intact without perforation.  Nose/Throat: External nose within normal limits, nasal crease present and septum midline. Turbinates edematous and pale with clear discharge. Posterior oropharynx erythematous with cobblestoning in the posterior oropharynx. Tonsils 2+ without exudates.  Tongue without  thrush and Geographic tongue present. Neck: Supple without thyromegaly. Trachea midline. Adenopathy: no enlarged lymph nodes appreciated in the anterior cervical, occipital, axillary, epitrochlear, inguinal, or popliteal regions. Lungs: Clear to auscultation without wheezing, rhonchi or rales. No increased work of breathing. CV: Normal S1/S2. No murmurs. Capillary refill <2 seconds.  Abdomen: Nondistended, nontender. No guarding or rebound tenderness. Bowel sounds faint and hypoactive  Skin: Warm and dry, without lesions or rashes. There are some lichenified lesions around  her neck and onto the upper part of her chest. She has eczematous peeling skin on her bilateral hands.  Extremities:  No clubbing, cyanosis or edema. Neuro:   Grossly intact. No focal deficits appreciated. Responsive to questions.  Diagnostic studies:   Spirometry: results normal (FEV1: 3.21/107%, FVC: 3.69/106%, FEV1/FVC: 87%).    Spirometry consistent with normal pattern.   Allergy Studies: none (we will review her testing from [facility] and possibly re-test once she is no longer pregnant)       Malachi Bonds, MD [facility]

## 2017-06-08 NOTE — Telephone Encounter (Signed)
Eucrisa isn't covered by [employer]. They cover Tacrolimus and Elidel, please advise

## 2017-06-09 MED ORDER — PIMECROLIMUS 1 % EX CREA: TOPICAL_CREAM | Freq: Two times a day (BID) | CUTANEOUS | 3 refills | Status: DC | PRN

## 2017-06-09 NOTE — Addendum Note (Signed)
Addended by: Virl Son D on: 06/09/2017 11:41 AM   Modules accepted: Orders

## 2017-06-09 NOTE — Addendum Note (Signed)
Addended by: Virl Son D on: 06/09/2017 10:31 AM   Modules accepted: Orders

## 2017-06-09 NOTE — Telephone Encounter (Signed)
Let's do Elidel twice daily as needed.  Malachi Bonds, MD [facility]

## 2017-06-09 NOTE — Telephone Encounter (Signed)
Elidel sent to [facility]

## 2017-06-16 LAB — IGE+ALLERGENS ZONE 2(30)
Alternaria Alternata IgE: <0.1 kU/L
Amer Sycamore IgE Qn: 0.19 kU/L — AB
Bermuda Grass IgE: 0.5 kU/L — AB
Cedar, Mountain IgE: 0.1 kU/L — AB
Cladosporium Herbarum IgE: <0.1 kU/L
Cockroach, American IgE: <0.1 kU/L
Common Silver Birch IgE: 0.34 kU/L — AB
D Farinae IgE: 7.26 kU/L — AB
D Pteronyssinus IgE: 6.84 kU/L — AB
G010-IGE JOHNSON GRASS: 0.35 kU/L — AB
G017-IGE BAHIA GRASS: 0.61 kU/L — AB
Hickory, White IgE: 0.49 kU/L — AB
IgE (Immunoglobulin E), Serum: 46 IU/mL (ref 6–495)
Maple/Box Elder IgE: 0.27 kU/L — AB
Mucor Racemosus IgE: <0.1 kU/L
Mugwort IgE Qn: 0.3 kU/L — AB
Nettle IgE: <0.1 kU/L
Oak, White IgE: 0.28 kU/L — AB
Penicillium Chrysogen IgE: <0.1 kU/L
Plantain, English IgE: 0.26 kU/L — AB
SWEET GUM IGE RAST QL: 0.29 kU/L — AB
Sheep Sorrel IgE Qn: 0.23 kU/L — AB
Stemphylium Herbarum IgE: <0.1 kU/L
T008-IGE ELM, AMERICAN: 0.17 kU/L — AB
TIMOTHY IGE: 0.76 kU/L — AB
W001-IGE RAGWEED, SHORT: 0.45 kU/L — AB
W014-IGE PIGWEED, ROUGH: 0.34 kU/L — AB

## 2017-06-16 LAB — CBC WITH DIFFERENTIAL/PLATELET
BASOS ABS: 0 10*3/uL (ref 0.0–0.2)
Basos: 0 %
EOS (ABSOLUTE): 0.1 10*3/uL (ref 0.0–0.4)
Eos: 2 %
Hematocrit: 28.4 % — ABNORMAL LOW (ref 34.0–46.6)
Hemoglobin: 10 g/dL — ABNORMAL LOW (ref 11.1–15.9)
Immature Grans (Abs): 0 10*3/uL (ref 0.0–0.1)
Immature Granulocytes: 0 %
LYMPHS ABS: 2.4 10*3/uL (ref 0.7–3.1)
Lymphs: 29 %
MCH: 29.1 pg (ref 26.6–33.0)
MCHC: 35.2 g/dL (ref 31.5–35.7)
MCV: 83 fL (ref 79–97)
MONOS ABS: 0.7 10*3/uL (ref 0.1–0.9)
Monocytes: 9 %
NEUTROS ABS: 4.8 10*3/uL (ref 1.4–7.0)
Neutrophils: 60 %
Platelets: 313 10*3/uL (ref 150–379)
RBC: 3.44 x10E6/uL — ABNORMAL LOW (ref 3.77–5.28)
RDW: 13.4 % (ref 12.3–15.4)
WBC: 8 10*3/uL (ref 3.4–10.8)

## 2017-06-20 ENCOUNTER — Encounter: Payer: Self-pay | Admitting: *Deleted

## 2017-09-08 ENCOUNTER — Ambulatory Visit: Payer: BLUE CROSS/BLUE SHIELD | Admitting: Allergy & Immunology

## 2017-10-28 ENCOUNTER — Encounter (HOSPITAL_BASED_OUTPATIENT_CLINIC_OR_DEPARTMENT_OTHER): Payer: Self-pay | Admitting: Emergency Medicine

## 2017-10-28 ENCOUNTER — Emergency Department (HOSPITAL_BASED_OUTPATIENT_CLINIC_OR_DEPARTMENT_OTHER)
Admission: EM | Admit: 2017-10-28 | Discharge: 2017-10-28 | Disposition: A | Discharge status: Home / Self Care | Payer: Medicaid Other | Attending: Emergency Medicine | Admitting: Emergency Medicine

## 2017-10-28 ENCOUNTER — Other Ambulatory Visit: Payer: Self-pay

## 2017-10-28 DIAGNOSIS — Z3A32 32 weeks gestation of pregnancy: Secondary | ICD-10-CM | POA: Insufficient documentation

## 2017-10-28 DIAGNOSIS — Z79899 Other long term (current) drug therapy: Secondary | ICD-10-CM | POA: Insufficient documentation

## 2017-10-28 DIAGNOSIS — J45909 Unspecified asthma, uncomplicated: Secondary | ICD-10-CM | POA: Diagnosis not present

## 2017-10-28 DIAGNOSIS — L259 Unspecified contact dermatitis, unspecified cause: Secondary | ICD-10-CM | POA: Insufficient documentation

## 2017-10-28 DIAGNOSIS — O9989 Other specified diseases and conditions complicating pregnancy, childbirth and the puerperium: Secondary | ICD-10-CM | POA: Insufficient documentation

## 2017-10-28 NOTE — ED Provider Notes (Signed)
[facility] [facility] EMERGENCY DEPARTMENT Provider Note   CSN: [id number] Arrival date & time: 10/28/17  1857     History   Chief Complaint Chief Complaint  Patient presents with  . Abscess    HPI Theresa Le is a 27 y.o. female.  The history is provided by the patient and medical records. No language interpreter was used.  Rash   This is a recurrent problem. The current episode started more than 2 days ago. The problem has not changed since onset.The problem is associated with an unknown factor. There has been no fever. The rash is present on the groin. The pain is at a severity of 1/10. The pain is mild. The pain has been constant since onset. Associated symptoms include pain. Pertinent negatives include no blisters and no itching. She has tried nothing for the symptoms. The treatment provided no relief. Risk factors: recently shaved groin.    Past Medical History:  Diagnosis Date  . Asthma   . Eczema     Patient Active Problem List   Diagnosis Date Noted  . Moderate persistent asthma without complication 06/08/2017  . Chronic rhinitis 06/08/2017  . Intrinsic atopic dermatitis 06/08/2017  . Pollen-food allergy syndrome 06/08/2017    Past Surgical History:  Procedure Laterality Date  . removal of fallopian tube  02/23/2017  . TONSILLECTOMY       OB History    Gravida  1   Para      Term      Preterm      AB      Living        SAB      TAB      Ectopic      Multiple      Live Births               Home Medications    Prior to Admission medications   Medication Sig Start Date End Date Taking? Authorizing Provider  albuterol (PROAIR HFA) 108 (90 Base) MCG/ACT inhaler Inhale 2 puffs into the lungs every 4 (four) hours as needed for wheezing or shortness of breath. 06/08/17   Alfonse Spruce, MD  budesonide-formoterol [facility]) 80-4.5 MCG/ACT inhaler Inhale 2 puffs into the lungs 2 (two) times daily. 06/08/17   Alfonse Spruce, MD   Crisaborole (EUCRISA) 2 % OINT Apply 1 application topically 2 (two) times daily as needed. 06/08/17   Alfonse Spruce, MD  diphenhydrAMINE (BENADRYL) 25 MG tablet Take 25 mg by mouth every 6 (six) hours as needed.    [provider]  montelukast (SINGULAIR) 10 MG tablet Take 1 tablet (10 mg total) by mouth at bedtime. 06/08/17   Alfonse Spruce, MD  pimecrolimus (ELIDEL) 1 % cream Apply topically 2 (two) times daily as needed. 06/09/17   Alfonse Spruce, MD  Prenatal Vit-Fe Fumarate-FA (PRENATAL COMPLETE PO) Take by mouth.    [provider]  triamcinolone ointment (KENALOG) 0.1 % Apply sparingly to affected areas twice daily as needed below face and neck 06/08/17   Alfonse Spruce, MD    Family History Family History  Problem Relation Age of Onset  . Allergic rhinitis Mother   . Eczema Brother   . Eczema Paternal Aunt   . Eczema Daughter   . Asthma Neg Hx   . Urticaria Neg Hx   . Immunodeficiency Neg Hx   . Angioedema Neg Hx     Social History Social History   Tobacco Use  .  Smoking status: Never Smoker  . Smokeless tobacco: Never Used  Substance Use Topics  . Alcohol use: No  . Drug use: No     Allergies   Patient has no known allergies.   Review of Systems Review of Systems  Constitutional: Negative for chills and fever.  HENT: Negative for ear pain and sore throat.   Eyes: Negative for pain and visual disturbance.  Respiratory: Negative for cough and shortness of breath.   Cardiovascular: Negative for chest pain and palpitations.  Gastrointestinal: Negative for abdominal pain, constipation, diarrhea, nausea and vomiting.  Genitourinary: Positive for vaginal pain. Negative for dysuria, flank pain, frequency, genital sores, hematuria, pelvic pain, urgency, vaginal bleeding and vaginal discharge.  Musculoskeletal: Negative for arthralgias, back pain, neck pain and neck stiffness.  Skin: Positive for rash. Negative for color change  and itching.  Neurological: Negative for seizures and syncope.  Psychiatric/Behavioral: Negative for agitation.  All other systems reviewed and are negative.    Physical Exam Updated Vital Signs BP 121/76 (BP Location: Right Arm)   Pulse 85   Temp 98.3 F (36.8 C) (Oral)   Resp 18   Ht 5\' 6"  (1.676 m)   Wt 76.2 kg   LMP 03/06/2017   SpO2 100%   BMI 27.12 kg/m   Physical Exam  Constitutional: She appears well-developed and well-nourished. No distress.  HENT:  Head: Normocephalic and atraumatic.  Mouth/Throat: Oropharynx is clear and moist. No oropharyngeal exudate.  Eyes: Pupils are equal, round, and reactive to light. Conjunctivae are normal.  Neck: Neck supple.  Cardiovascular: Normal rate and regular rhythm.  No murmur heard. Pulmonary/Chest: Effort normal and breath sounds normal. No respiratory distress. She has no wheezes. She has no rales. She exhibits no tenderness.  Abdominal: Soft. She exhibits distension (gravid). There is no tenderness. There is no rebound. No hernia.  Genitourinary:    Pelvic exam was performed with patient supine. There is rash and lesion on the left labia. There is no tenderness or injury on the left labia. No vaginal discharge found.  Musculoskeletal: She exhibits tenderness. She exhibits no edema.  Neurological: She is alert. No sensory deficit. She exhibits normal muscle tone.  Skin: Skin is warm. Capillary refill takes less than 2 seconds. Rash noted. She is not diaphoretic. No erythema. No pallor.  Psychiatric: She has a normal mood and affect.  Nursing note and vitals reviewed.    ED Treatments / Results  Labs (all labs ordered are listed, but only abnormal results are displayed) Labs Reviewed - No data to display  EKG None  Radiology No results found.  Procedures Procedures (including critical care time)  Medications Ordered in ED Medications - No data to display   Initial Impression / Assessment and Plan / ED Course    I have reviewed the triage vital signs and the nursing notes.  Pertinent labs & imaging results that were available during my care of the patient were reviewed by me and considered in my medical decision making (see chart for details).     Theresa Le is a 27 y.o. female who is [redacted] weeks pregnant with a history of asthma and eczema who presents with left vaginal irritation.  Patient reports that she shaved her groin just over week ago and developed irritation on her left external labia.  After several days it improved however she had a recurrent area of irritation starting 2 days ago.  She denies any drainage, purulence, erythema, or fluctuance.  She reports it is  surrounding an area where she shaved follicles.  She reports no history of genital herpes and no recent STI.  She denies any abdominal pains that are new.  She denies any vaginal discharge, vaginal bleeding, or urinary symptoms.  She thinks it is likely related to her shaving.   As she is [redacted] weeks pregnant and will be delivering several weeks, she wanted to be examined and make sure there was not a concerning infection there presently.  On exam, lungs clear chest nontender.  Abdomen is nontender.  Patient is gravid.  Back nontender.  A chaperone was present and a pelvic exam was performed externally.  Patient has 1 small 1 mm area of irritation on her left labia where there are follicles.  It does not look vesicular, does not appear herpetic.  Suspect irritation from her shaving.  Given the mild irritation, do not feel patient needs antibiotics or antivirals at this time.  Patient will follow-up with her OB/GYN team for further assessment and reevaluation prior to her delivery.    Given her lack of urinary symptoms, do not feel strongly she needs urinalysis or lab testing.  Patient reports baby is moving normally and she has no abdominal pain, do not feel she needs further abdominal work-up at this time.  Given her lack of other pelvic  symptoms, do not feel strongly about performing an internal pelvic exam.  Patient requested external exam only.  Given well appearance, patient will be discharged home.  She understood return precautions and had no other questions or concerns.  Patient discharged in good condition.         Final Clinical Impressions(s) / ED Diagnoses   Final diagnoses:  Skin irritation from shaving    ED Discharge Orders    None     Clinical Impression: 1. Skin irritation from shaving     Disposition: Discharge  Condition: Good  I have discussed the results, Dx and Tx plan with the pt(& family if present). He/she/they expressed understanding and agree(s) with the plan. Discharge instructions discussed at great length. Strict return precautions discussed and pt &/or family have verbalized understanding of the instructions. No further questions at time of discharge.    Discharge Medication List as of 10/28/2017 10:54 PM      Follow Up: [facility] [address] [place] [address] [phone] Schedule an appointment as soon as possible for a visit    [facility] [place] [facility] [address] [id number] [address] [place] [address] [phone]       Tegeler, Canary Brim, MD 10/29/17 [id number]

## 2017-10-28 NOTE — Discharge Instructions (Signed)
Your exam today was consistent with irritation from your recent pelvic shaving.  Our exam was not consistent with a herpes outbreak or bacterial infection.  As you are pregnant, we recommend you following up with your OB/GYN team for reassessment and further management.  We do not feel you need to be started on any antivirals or antibiotics at this time.  As you have not had any new abdominal pain, vaginal discharge, vaginal bleeding, and still feel your baby moving normally, we did not pursue ultrasound imaging today or any other invasive pelvic exams.  If any symptoms change or worsen acutely, please return to the nearest emergency department.

## 2017-10-28 NOTE — ED Triage Notes (Signed)
Patient states that she has an abcess to her pubic region.

## 2017-10-28 NOTE — ED Notes (Signed)
ED Provider at bedside. 

## 2017-10-28 NOTE — ED Notes (Signed)
Pt reports feeling good fetal movement

## 2017-10-28 NOTE — ED Notes (Signed)
Pt ambulatory to d/c window with steady gait 

## 2018-02-21 IMAGING — CR DG CHEST 2V
2 series · 2 of 2 positions shown · non-contrast
Comparison: None.

CLINICAL DATA: Mid chest tightness. Shortness of breath. Duration:
1 day.

EXAM:
CHEST  2 VIEW

[w chest pa]
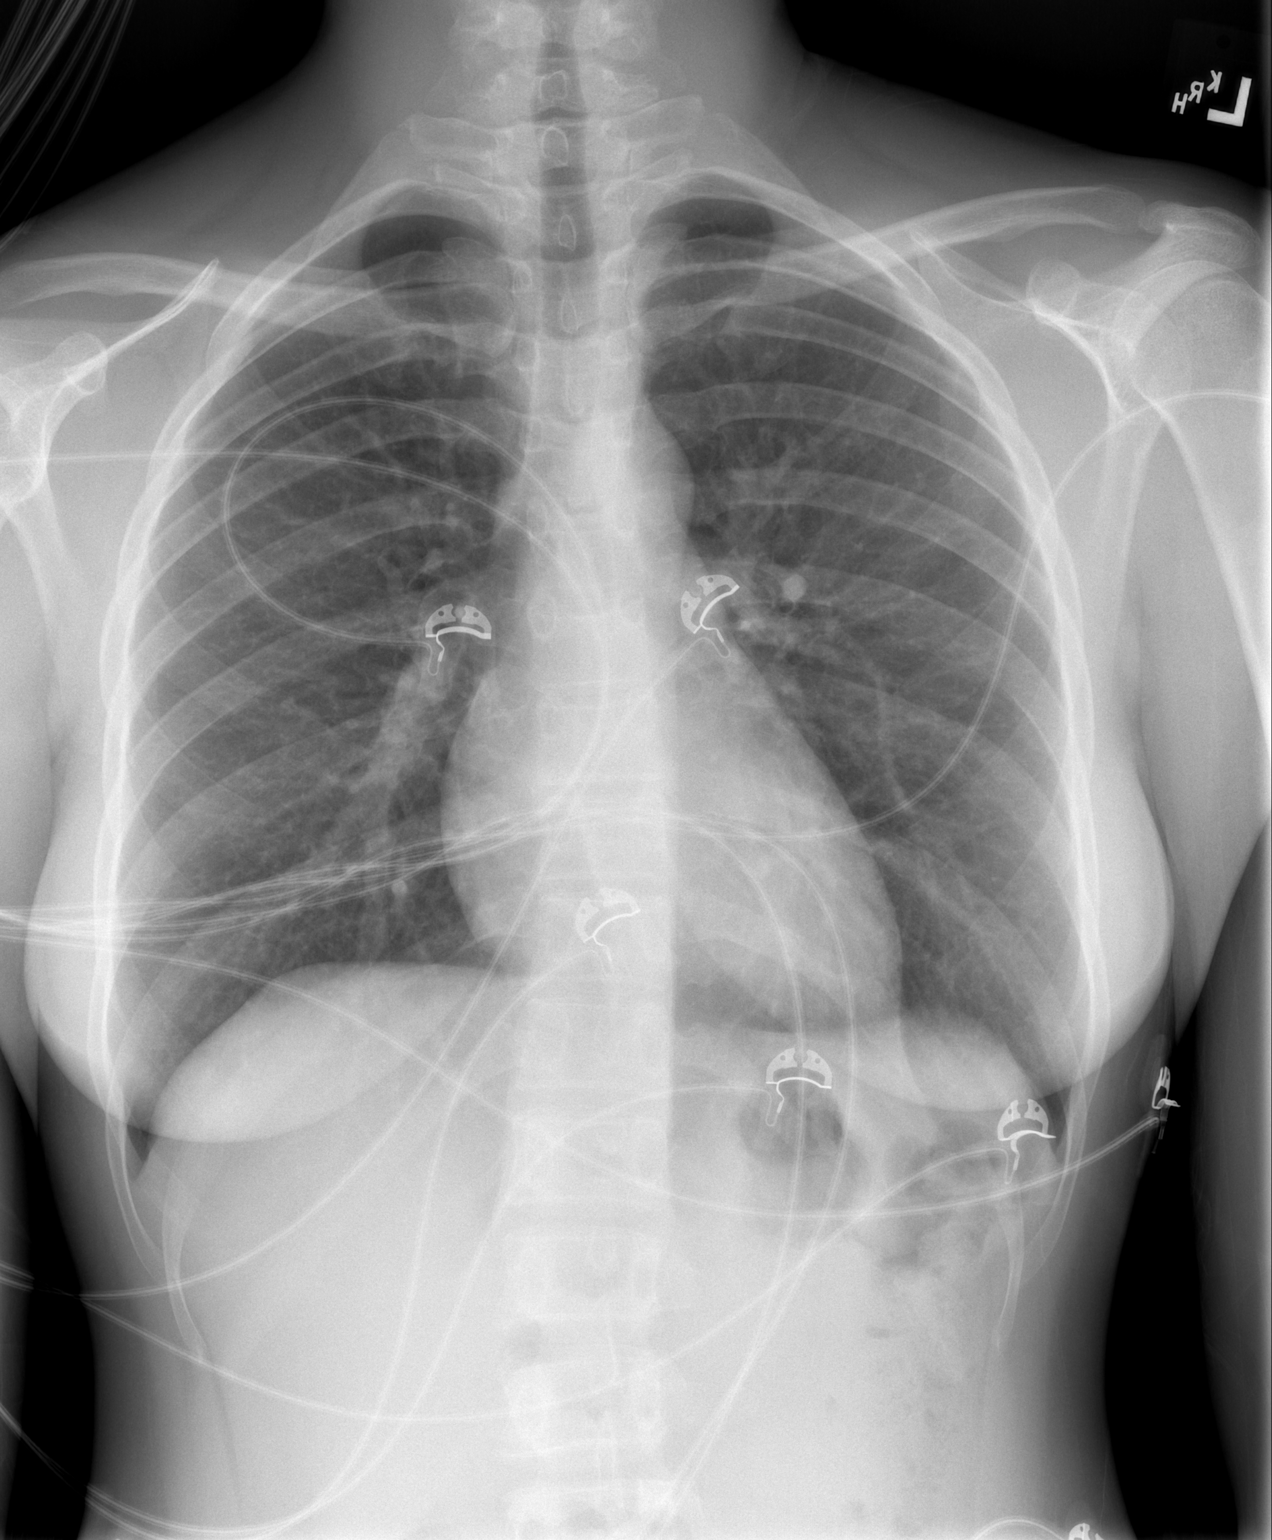

[w chest lat]
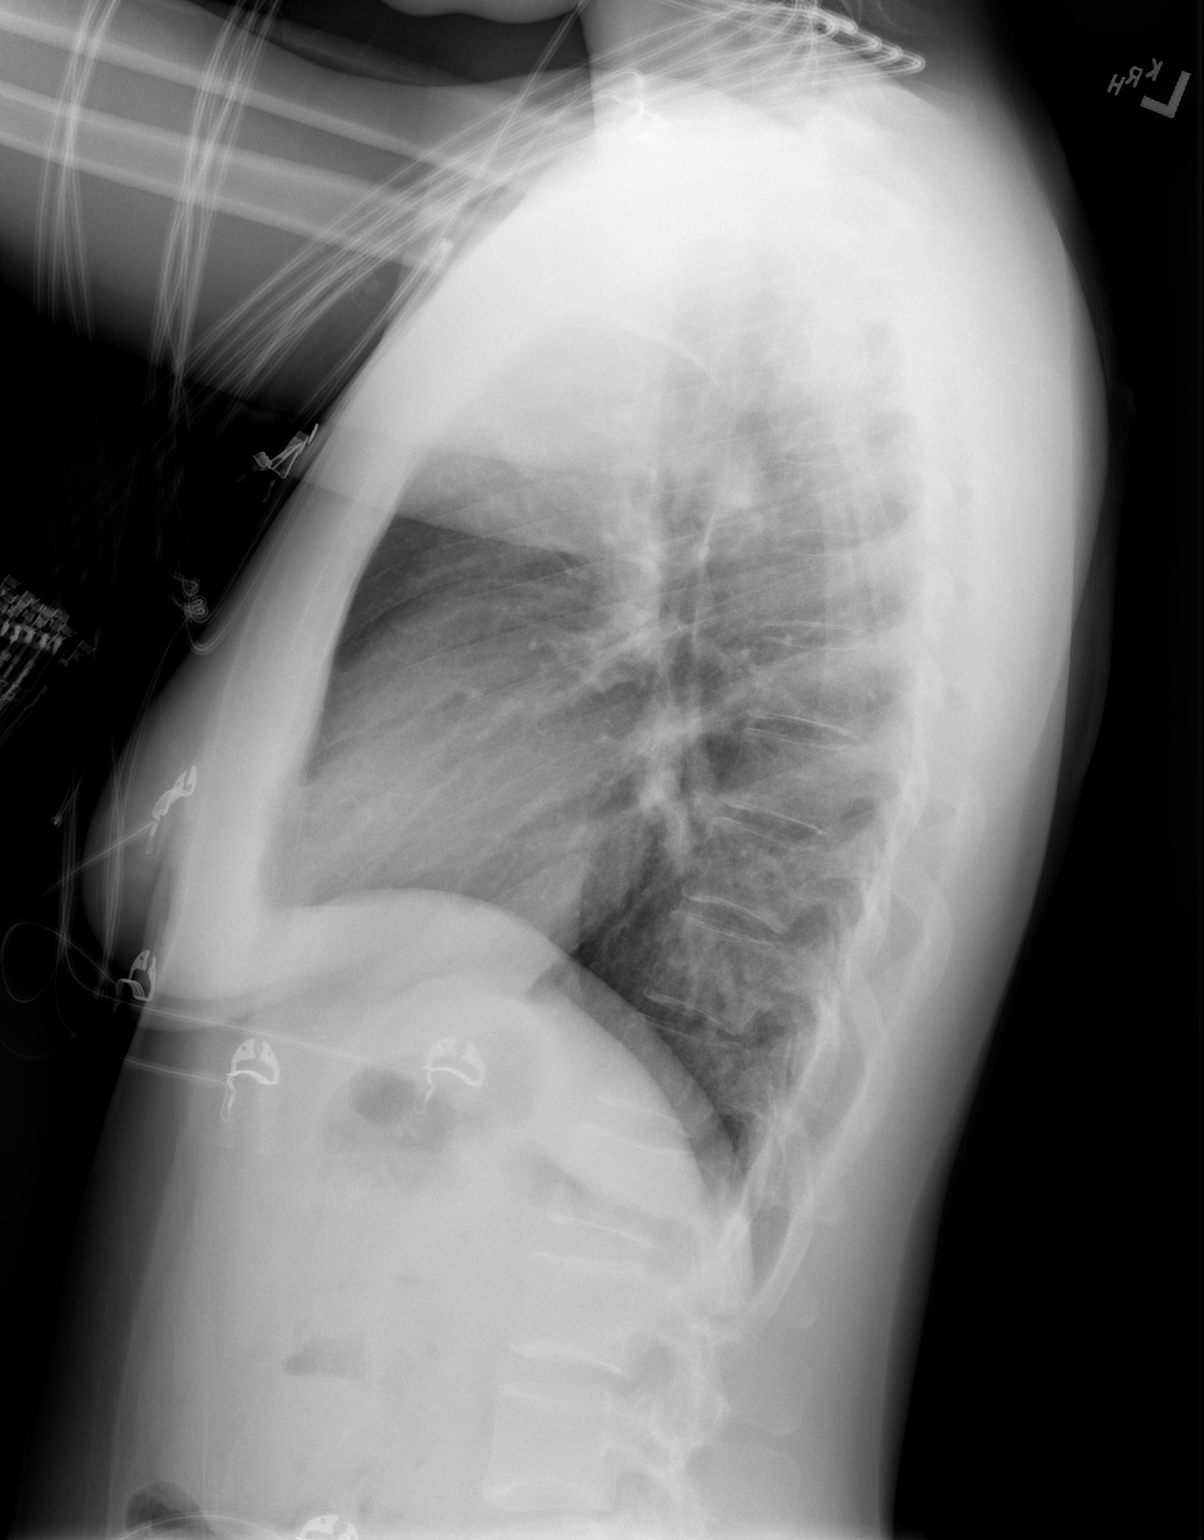

[2 of 2 positions shown; findings below may reference images not displayed]

FINDINGS: The heart size and mediastinal contours are within normal limits.
Both lungs are clear. The visualized skeletal structures are
unremarkable.
IMPRESSION: No active cardiopulmonary disease.

## 2018-07-09 ENCOUNTER — Other Ambulatory Visit: Payer: Self-pay

## 2018-07-09 ENCOUNTER — Ambulatory Visit (INDEPENDENT_AMBULATORY_CARE_PROVIDER_SITE_OTHER): Payer: Managed Care, Other (non HMO) | Admitting: Family Medicine

## 2018-07-09 ENCOUNTER — Encounter: Payer: Self-pay | Admitting: Family Medicine

## 2018-07-09 VITALS — BP 110/68 | HR 80 | Temp 98.0°F | Resp 16 | Ht 66.0 in | Wt 182.0 lb

## 2018-07-09 DIAGNOSIS — J310 Chronic rhinitis: Secondary | ICD-10-CM

## 2018-07-09 DIAGNOSIS — T781XXD Other adverse food reactions, not elsewhere classified, subsequent encounter: Secondary | ICD-10-CM | POA: Diagnosis not present

## 2018-07-09 DIAGNOSIS — J4541 Moderate persistent asthma with (acute) exacerbation: Secondary | ICD-10-CM

## 2018-07-09 DIAGNOSIS — L2084 Intrinsic (allergic) eczema: Secondary | ICD-10-CM | POA: Diagnosis not present

## 2018-07-09 DIAGNOSIS — T7819XD Other adverse food reactions, not elsewhere classified, subsequent encounter: Secondary | ICD-10-CM

## 2018-07-09 MED ORDER — MONTELUKAST SODIUM 10 MG PO TABS: 10.0000 mg | ORAL_TABLET | Freq: Every day | ORAL | 5 refills | Status: DC

## 2018-07-09 MED ORDER — TRIAMCINOLONE ACETONIDE 0.1 % EX OINT: TOPICAL_OINTMENT | CUTANEOUS | 3 refills | Status: DC

## 2018-07-09 MED ORDER — HYDROXYZINE HCL 25 MG PO TABS: 25.0000 mg | ORAL_TABLET | Freq: Three times a day (TID) | ORAL | 5 refills | Status: DC | PRN

## 2018-07-09 MED ORDER — CRISABOROLE 2 % EX OINT: 1.0000 "application " | TOPICAL_OINTMENT | Freq: Two times a day (BID) | CUTANEOUS | 3 refills | Status: DC | PRN

## 2018-07-09 MED ORDER — BUDESONIDE-FORMOTEROL FUMARATE 80-4.5 MCG/ACT IN AERO: 2.0000 | INHALATION_SPRAY | Freq: Two times a day (BID) | RESPIRATORY_TRACT | 5 refills | Status: DC

## 2018-07-09 MED ORDER — ALBUTEROL SULFATE HFA 108 (90 BASE) MCG/ACT IN AERS: 2.0000 | INHALATION_SPRAY | RESPIRATORY_TRACT | 2 refills | Status: DC | PRN

## 2018-07-09 NOTE — Patient Instructions (Addendum)
Moderate persistent asthma without complication Begin montelukast 10 mg once a day to prevent cough or wheeze Begin Symbicort 80-2 puffs twice a day with a spacer to prevent cough or wheeze Continue ProAir 2 puffs every 4 hours as needed for cough or wheeze  Allergic rhinitis Continue Claritin 10 mg once a day as needed for a stuffy nose Continue Flonase 1-2 sprays in each nostril once a day as needed for a stuffy nose Consider nasal saline rinses as needed for nasal symptoms. Use this before medicated nasal sprays Consider allergen immunotherapy if medications are not effective for you  Intrinsic atopic dermatitis Continue daily moisturizing as you have been Triamcinolone 0.1% ointment twice a day to red, itchy areas below your face and neck Apply Eucrisa twice a day to red, itchy areas. This does not contain a steroid and can be used on your face and neck Consider Dupixent injections for eczema control. Written information provided Begin hydroxyzine 25 mg once at night to decrease itch Prednisone 10 mg tablets. Take 2 tablets once a day for the next 4 days, then take 1 tablet on the 5th day, then stop  Pollen-food allergy syndrome Continue to avoid the foods that make your mouth and tongue tingle. Take Benadryl 50 mg for mild symptoms and call 911 for life threatening symptoms.  Call the clinic if this treatment plan is not working well for you  Follow up in 2 months or sooner if needed

## 2018-07-09 NOTE — Progress Notes (Addendum)
[address] [place] [address] Dept: [phone]  FOLLOW UP NOTE  Patient ID: Theresa Le, female    DOB: [birth year]  Age: 28 y.o. MRN: [id number] Date of Office Visit: 07/09/2018  Assessment  Chief Complaint: Asthma  HPI Theresa Le is a 28 year old female who presents to the clinic for a follow up visit. She reports her eczema has been poorly controlled for the last 2 months with red, itchy, rough areas on both hands, left more than right, flexural areas of bilateral antecubital fosse, left side of her neck, and bilateral breasts. She reports the eczema flares twice a year over the summer and winter. She is currently using Eucerin Eczema cream once a day as well as cortisone 10 with no relief. Asthma is reported as moderately well controlled with shortness of breath when outside or in the heat. She denies wheeze and cough. She is not taking Symbicort 160 or montelukast at this time. She continues albuterol 3-4 times a week with short term relief of symptoms. Allergic rhinitis is reports as moderately well controlled with clear runny nose and occasional itchy throat while outside. She continues Claritin as needed and is not currently using Flonase. She continues to avoid fresh fruits and cereal as she reports these cause her mouth to itch. She denies cardiopulmonary or gastrointestinal symptoms with the mouth itching. Her current medications are listed in the chart.   Drug Allergies:  No Known Allergies  Physical Exam: BP 110/68   Pulse 80   Temp 98 F (36.7 C) (Oral)   Resp 16   Ht 5\' 6"  (1.676 m)   Wt 182 lb (82.6 kg)   SpO2 97%   BMI 29.38 kg/m    Physical Exam Vitals signs reviewed.  Constitutional:      Appearance: Normal appearance.  HENT:     Head: Normocephalic and atraumatic.     Right Ear: Tympanic membrane normal.     Left Ear: Tympanic membrane normal.     Nose:     Comments: Bilateral nares edematous and pale with clear nasal drainage noted. Pharynx  normal. Ears normal. Eyes normal    Mouth/Throat:     Pharynx: Oropharynx is clear.  Eyes:     Conjunctiva/sclera: Conjunctivae normal.  Neck:     Musculoskeletal: Normal range of motion and neck supple.  Cardiovascular:     Rate and Rhythm: Normal rate and regular rhythm.     Heart sounds: Normal heart sounds. No murmur.  Pulmonary:     Effort: Pulmonary effort is normal.     Breath sounds: Normal breath sounds.     Comments: Lungs clear to auscultation Musculoskeletal: Normal range of motion.  Skin:    Comments: Rough, dry, hypopigmented and hyperpigmented areas on bilateral hands, neck, antecubital fosse, and breasts.   Neurological:     Mental Status: She is alert.     Diagnostics: FVC 3.16, FEV1 2.75. Predicted FVC 3.48, predicted FEV1 2.97. Spirometry is wihtin the normal range.  Assessment and Plan: 1. Moderate persistent asthma with acute exacerbation   2. Chronic rhinitis   3. Intrinsic atopic dermatitis   4. Pollen-food allergy syndrome     Meds ordered this encounter  Medications  . budesonide-formoterol (SYMBICORT) 80-4.5 MCG/ACT inhaler    Sig: Inhale 2 puffs into the lungs 2 (two) times daily.    Dispense:  1 Inhaler    Refill:  5  . albuterol (PROAIR HFA) 108 (90 Base) MCG/ACT inhaler    Sig: Inhale  2 puffs into the lungs every 4 (four) hours as needed for wheezing or shortness of breath.    Dispense:  1 Inhaler    Refill:  2  . montelukast (SINGULAIR) 10 MG tablet    Sig: Take 1 tablet (10 mg total) by mouth at bedtime.    Dispense:  30 tablet    Refill:  5  . Crisaborole (EUCRISA) 2 % OINT    Sig: Apply 1 application topically 2 (two) times daily as needed.    Dispense:  60 g    Refill:  3  . triamcinolone ointment (KENALOG) 0.1 %    Sig: Apply sparingly to affected areas twice daily as needed below face and neck    Dispense:  30 g    Refill:  3  . hydrOXYzine (ATARAX/VISTARIL) 25 MG tablet    Sig: Take 1 tablet (25 mg total) by mouth 3 (three)  times daily as needed.    Dispense:  30 tablet    Refill:  5    Patient Instructions  Moderate persistent asthma without complication Begin montelukast 10 mg once a day to prevent cough or wheeze Begin Symbicort 80-2 puffs twice a day with a spacer to prevent cough or wheeze Continue ProAir 2 puffs every 4 hours as needed for cough or wheeze  Allergic rhinitis Continue Claritin 10 mg once a day as needed for a stuffy nose Continue Flonase 1-2 sprays in each nostril once a day as needed for a stuffy nose Consider nasal saline rinses as needed for nasal symptoms. Use this before medicated nasal sprays Consider allergen immunotherapy if medications are not effective for you  Intrinsic atopic dermatitis Continue daily moisturizing as you have been Triamcinolone 0.1% ointment twice a day to red, itchy areas below your face and neck Apply Eucrisa twice a day to red, itchy areas. This does not contain a steroid and can be used on your face and neck Consider Dupixent injections for eczema control. Written information provided Begin hydroxyzine 25 mg once at night to decrease itch Prednisone 10 mg tablets. Take 2 tablets once a day for the next 4 days, then take 1 tablet on the 5th day, then stop  Pollen-food allergy syndrome Continue to avoid the foods that make your mouth and tongue tingle. Take Benadryl 50 mg for mild symptoms and call 911 for life threatening symptoms.  Call the clinic if this treatment plan is not working well for you  Follow up in 2 months or sooner if needed   Return in about 2 months (around 09/08/2018), or if symptoms worsen or fail to improve.   Thank you for the opportunity to care for this patient.  Please do not hesitate to contact me with questions.  Thermon LeylandAnne Ambs, FNP [facility]  I have provided oversight concerning Thermon Leylandnne Ambs' evaluation and treatment of this patient's health issues addressed during  today's encounter. I agree with the assessment and therapeutic plan as outlined in the note.   Thank you for the opportunity to care for this patient.  Please do not hesitate to contact me with questions.  Tonette BihariJ. A. Kiwana Deblasi, M.D.  [facility] [address] [place], [place] [id number] [phone]

## 2018-07-10 ENCOUNTER — Other Ambulatory Visit: Payer: Self-pay | Admitting: *Deleted

## 2018-07-10 MED ORDER — CRISABOROLE 2 % EX OINT: 1.0000 "application " | TOPICAL_OINTMENT | Freq: Two times a day (BID) | CUTANEOUS | 3 refills | Status: DC | PRN

## 2018-09-10 ENCOUNTER — Ambulatory Visit: Payer: Managed Care, Other (non HMO) | Admitting: Family Medicine

## 2018-09-12 ENCOUNTER — Telehealth: Payer: Self-pay | Admitting: Family Medicine

## 2018-09-12 NOTE — Telephone Encounter (Signed)
Anne please advise. 

## 2018-09-12 NOTE — Telephone Encounter (Signed)
Can you please ask if she has noted any improvement at all? Did the prednisone from her last visit help? Is she taking hydroxyzine or cetirizine? Thank you

## 2018-09-12 NOTE — Telephone Encounter (Signed)
PT called in says eucrisa and kenalog has not cleared up her skin. Still itchy patches on hands and neck. Has tried both creams since last OV and she thinks it is getting worse. PT is requesting stronger medicine.  Ernesto Rutherford [phone]

## 2018-09-13 ENCOUNTER — Other Ambulatory Visit: Payer: Self-pay | Admitting: Family Medicine

## 2018-09-13 MED ORDER — DESONIDE 0.05 % EX CREA: TOPICAL_CREAM | CUTANEOUS | 3 refills | Status: DC

## 2018-09-13 MED ORDER — MONTELUKAST SODIUM 10 MG PO TABS: 10.0000 mg | ORAL_TABLET | Freq: Every day | ORAL | 5 refills | Status: DC

## 2018-09-13 NOTE — Telephone Encounter (Signed)
Informed pt and mailed out info

## 2018-09-13 NOTE — Telephone Encounter (Signed)
She said that the prednisone did help but it made her nauseous. She is taking hydroxyzine as needed and needs a refill of montelukast.

## 2018-09-19 ENCOUNTER — Telehealth: Payer: Self-pay | Admitting: *Deleted

## 2018-09-19 NOTE — Telephone Encounter (Signed)
PA approved for desonide 0.05 cream. Faxed to [facility].

## 2018-09-26 ENCOUNTER — Ambulatory Visit (INDEPENDENT_AMBULATORY_CARE_PROVIDER_SITE_OTHER): Payer: 59 | Admitting: Family Medicine

## 2018-09-26 ENCOUNTER — Other Ambulatory Visit: Payer: Self-pay

## 2018-09-26 ENCOUNTER — Encounter: Payer: Self-pay | Admitting: Family Medicine

## 2018-09-26 VITALS — BP 100/68 | HR 83 | Temp 98.7°F | Resp 16

## 2018-09-26 DIAGNOSIS — T781XXD Other adverse food reactions, not elsewhere classified, subsequent encounter: Secondary | ICD-10-CM | POA: Diagnosis not present

## 2018-09-26 DIAGNOSIS — L2084 Intrinsic (allergic) eczema: Secondary | ICD-10-CM

## 2018-09-26 DIAGNOSIS — J3089 Other allergic rhinitis: Secondary | ICD-10-CM | POA: Diagnosis not present

## 2018-09-26 DIAGNOSIS — J4530 Mild persistent asthma, uncomplicated: Secondary | ICD-10-CM

## 2018-09-26 DIAGNOSIS — J302 Other seasonal allergic rhinitis: Secondary | ICD-10-CM

## 2018-09-26 MED ORDER — FLOVENT HFA 110 MCG/ACT IN AERO: 2.0000 | INHALATION_SPRAY | Freq: Two times a day (BID) | RESPIRATORY_TRACT | 5 refills | Status: DC

## 2018-09-26 MED ORDER — FLUOCINOLONE ACETONIDE SCALP 0.01 % EX OIL: 1.0000 "application " | TOPICAL_OIL | Freq: Every day | CUTANEOUS | 2 refills | Status: DC

## 2018-09-26 NOTE — Patient Instructions (Addendum)
Intrinsic atopic dermatitis Begin Derma-Smoothe oil to your scalp once a day. If no improvement in your scalp in the next 2 weeks, follow up with your dermatologist We will submit the paperwork needed to start [place]. You will hear from out biologics coordinator, Tammy next. Continue daily moisturizing as you have been Continue triamcinolone 0.1% ointment twice a day to red, itchy areas below your face and neck Continue Eucrisa twice a day to red, itchy areas. This does not contain a steroid and can be used on your face and neck Continue hydroxyzine 25 mg once at night to decrease itch  Moderate persistent asthma without complication Continue montelukast 10 mg once a day to prevent cough or wheeze Continue ProAir 2 puffs every 4 hours as needed for cough or wheeze For asthma flare begin Flovent 110-2 puffs twice a day with a spacer for 2 weeks or until cough or wheeze free  Allergic rhinitis Continue Claritin 10 mg once a day as needed for a stuffy nose Continue Flonase 1-2 sprays in each nostril once a day as needed for a stuffy nose Consider nasal saline rinses as needed for nasal symptoms. Use this before medicated nasal sprays Consider allergen immunotherapy if medications are not effective for you  Pollen-food allergy syndrome Continue to avoid the foods that make your mouth and tongue tingle. Take Benadryl 50 mg for mild symptoms and call 911 for life threatening symptoms.  Call the clinic if this treatment plan is not working well for you  Follow up in 3 months or sooner if needed

## 2018-09-26 NOTE — Progress Notes (Addendum)
[address] [place] [address] Dept: [phone]  FOLLOW UP NOTE  Patient ID: Theresa Le, female    DOB: 04-06-1990  Age: 28 y.o. MRN: [id number] Date of Office Visit: 09/26/2018  Assessment  Chief Complaint: Eczema  HPI Theresa Le is a 28 year old female who presents to the clinic for a follow up visit. She was last seen in this clinic on 07/09/2018 for evaluation of atopic dermatitis, asthma, allergic rhinitis, and oral allergy syndrome.  At today's visit she reports her eczema has not been well controlled with red itchy areas occurring on her neck, and hands.  She reports using a combination of Eucrisa and triamcinolone with a slight improvement initially followed by worsening of her eczema.  She reports that the prednisone from her last visit helped slightly with her eczema flare, however made her extremely nauseated.  She reports red itchy areas with flakes on her scalp that began in April.  She reports these dry patches started on the left occipital area and now has moved to the right occipital area.  She reports that these areas can become extreemely pruritic and she describes the area on the right as occasionally wet and occasionally bleeding. She has tried a dandruff shampoo, and several scalp moisturizers which work temporarily to relieve the itch.  She reports asthma as well controlled with occasional shortness of breath with activity.  She denies cough and wheeze with activity and rest.  She continues montelukast once a day albuterol once every 2 weeks.  She is not using Symbicort as it was not covered on her insurance and financially unavailable.  Allergic rhinitis is reported as moderately well controlled with occasional clear rhinorrhea, throat and ear itching for which she uses Claritin with moderate success.  Her current medications are listed in the chart.   Drug Allergies:  No Known Allergies  Physical Exam: BP 100/68   Pulse 83   Temp 98.7 F (37.1 C) (Oral)    Resp 16   SpO2 98%    Physical Exam Vitals signs reviewed.  Constitutional:      Appearance: Normal appearance.  HENT:     Head: Normocephalic and atraumatic.     Right Ear: Tympanic membrane normal.     Left Ear: Tympanic membrane normal.     Nose:     Comments: Bilateral nares slightly erythematous with clear nasal drainage noted.  Pharynx normal.  Ears normal.  Eyes normal.    Mouth/Throat:     Pharynx: Oropharynx is clear.  Eyes:     Conjunctiva/sclera: Conjunctivae normal.  Neck:     Musculoskeletal: Normal range of motion and neck supple.  Cardiovascular:     Rate and Rhythm: Normal rate and regular rhythm.     Heart sounds: Normal heart sounds. No murmur.  Pulmonary:     Effort: Pulmonary effort is normal.     Breath sounds: Normal breath sounds.     Comments: Lungs clear to auscultation Musculoskeletal: Normal range of motion.  Skin:    General: Skin is warm.     Comments: Hyperpigmented areas on bilateral hands, neck, and on her torso. Yellow/white, dry flaky skin on right and left sides of she scalp. No open areas or drainage noted.   Neurological:     Mental Status: She is alert and oriented to person, place, and time.  Psychiatric:        Mood and Affect: Mood normal.        Behavior: Behavior normal.  Thought Content: Thought content normal.        Judgment: Judgment normal.     Diagnostics: FVC 3.37, FEV1 2.91.  Predicted FVC 3.48, predicted FEV1 2.97.  Spirometry indicates normal ventilatory function.  Assessment and Plan: 1. Mild persistent asthma without complication   2. Seasonal and perennial allergic rhinitis   3. Intrinsic atopic dermatitis   4. Pollen-food allergy syndrome     Meds ordered this encounter  Medications  . Fluocinolone Acetonide Scalp 0.01 % OIL    Sig: Apply 1 application topically daily.    Dispense:  118.28 mL    Refill:  2  . fluticasone (FLOVENT HFA) 110 MCG/ACT inhaler    Sig: Inhale 2 puffs into the lungs 2  (two) times daily. For asthma flare, use 2 puffs twice a day with a spacer for 2 weeks or until cough and wheeze free    Dispense:  1 Inhaler    Refill:  5    Hold. Patient will call when needed    Patient Instructions  Intrinsic atopic dermatitis Begin Derma-Smoothe oil to your scalp once a day. If no improvement in your scalp in the next 2 weeks, follow up with your dermatologist We will submit the paperwork needed to start Dupixent. You will hear from out biologics coordinator, Tammy next. Continue daily moisturizing as you have been Continue triamcinolone 0.1% ointment twice a day to red, itchy areas below your face and neck Continue Eucrisa twice a day to red, itchy areas. This does not contain a steroid and can be used on your face and neck Continue hydroxyzine 25 mg once at night to decrease itch  Mild persistent asthma without complication Continue montelukast 10 mg once a day to prevent cough or wheeze Continue ProAir 2 puffs every 4 hours as needed for cough or wheeze For asthma flare begin Flovent 110-2 puffs twice a day with a spacer for 2 weeks or until cough or wheeze free  Allergic rhinitis Continue Claritin 10 mg once a day as needed for a stuffy nose Continue Flonase 1-2 sprays in each nostril once a day as needed for a stuffy nose Consider nasal saline rinses as needed for nasal symptoms. Use this before medicated nasal sprays Consider allergen immunotherapy if medications are not effective for you  Pollen-food allergy syndrome Continue to avoid the foods that make your mouth and tongue tingle. Take Benadryl 50 mg for mild symptoms and call 911 for life threatening symptoms.  Call the clinic if this treatment plan is not working well for you  Follow up in 3 months or sooner if needed   Return in about 3 months (around 12/27/2018), or if symptoms worsen or fail to improve.    Thank you for the opportunity to care for this patient.  Please do not hesitate to  contact me with questions.  [name], FNP [facility]   ________________________________________________  I have provided oversight concerning [facility]'s evaluation and treatment of this patient's health issues addressed during today's encounter.  I agree with the assessment and therapeutic plan as outlined in the note.   Signed,   R Jorene Guestarter Bobbitt, MD

## 2018-10-02 ENCOUNTER — Telehealth: Payer: Self-pay | Admitting: *Deleted

## 2018-10-02 NOTE — Telephone Encounter (Signed)
T/C to patient advised Theresa Le wanted me to reach out to discuss starting Wall[place] for atopic dermatitis.  She does want to start. I advised her I have her Ins approval and will sign her up for $0 copay card and send Rx to [facility]. They will reach out to her to set up delivery and ship to her home. Once she receives medication she needs to reach out to our clinic to make appt to start therapy and receive instruction on self administer.

## 2018-10-24 ENCOUNTER — Other Ambulatory Visit: Payer: Self-pay | Admitting: Family Medicine

## 2018-11-01 ENCOUNTER — Ambulatory Visit (INDEPENDENT_AMBULATORY_CARE_PROVIDER_SITE_OTHER): Payer: 59

## 2018-11-01 ENCOUNTER — Other Ambulatory Visit: Payer: Self-pay

## 2018-11-01 DIAGNOSIS — L209 Atopic dermatitis, unspecified: Secondary | ICD-10-CM

## 2018-11-01 MED ORDER — DUPILUMAB 300 MG/2ML ~~LOC~~ SOSY
300.0000 mg | PREFILLED_SYRINGE | Freq: Once | SUBCUTANEOUS | Status: AC
  Administered 2018-11-01: 16:00:00 300 mg via SUBCUTANEOUS

## 2018-11-01 MED ORDER — EPINEPHRINE 0.3 MG/0.3ML IJ SOAJ: INTRAMUSCULAR | 1 refills | Status: DC

## 2018-11-01 NOTE — Progress Notes (Signed)
Patient started loading dose of Dupixent today (300 mg given by me and 300 mg given by patient). Instructions given, no problems. Will be getting injection at home going forward. EAP given and Auvi-Q rx sent.

## 2019-02-28 ENCOUNTER — Other Ambulatory Visit: Payer: Self-pay

## 2019-02-28 ENCOUNTER — Encounter: Payer: Self-pay | Admitting: Allergy & Immunology

## 2019-02-28 ENCOUNTER — Telehealth (INDEPENDENT_AMBULATORY_CARE_PROVIDER_SITE_OTHER): Payer: PRIVATE HEALTH INSURANCE | Admitting: Allergy & Immunology

## 2019-02-28 ENCOUNTER — Ambulatory Visit: Payer: PRIVATE HEALTH INSURANCE | Admitting: Allergy & Immunology

## 2019-02-28 DIAGNOSIS — J4541 Moderate persistent asthma with (acute) exacerbation: Secondary | ICD-10-CM

## 2019-02-28 DIAGNOSIS — J302 Other seasonal allergic rhinitis: Secondary | ICD-10-CM

## 2019-02-28 DIAGNOSIS — L2089 Other atopic dermatitis: Secondary | ICD-10-CM | POA: Diagnosis not present

## 2019-02-28 DIAGNOSIS — T781XXD Other adverse food reactions, not elsewhere classified, subsequent encounter: Secondary | ICD-10-CM

## 2019-02-28 DIAGNOSIS — J3089 Other allergic rhinitis: Secondary | ICD-10-CM | POA: Diagnosis not present

## 2019-02-28 NOTE — Progress Notes (Signed)
RE: Davionna Blacksher MRN: [id number] DOB: December 18, 1990 Date of Telemedicine Visit: 02/28/2019  Referring provider: No ref. provider found Primary care provider: Patient, No Pcp Per  Chief Complaint: Renewal for [place]   Telemedicine Follow Up Visit via WebEx: I connected with Theresa Le for a follow up on 02/28/19 by [place] and verified that I am speaking with the correct person using two identifiers.   I discussed the limitations, risks, security and privacy concerns of performing an evaluation and management service by telemedicine and the availability of in person appointments. I also discussed with the patient that there may be a patient responsible charge related to this service. The patient expressed understanding and agreed to proceed.  Patient is at work.  Provider is at the office.  Visit start time: 10:06 AM Visit end time: 10:29 PM Insurance consent/check in by: [facility] consent and medical assistant/nurse: Lisabeth Pick  History of Present Illness:  She is a 29 y.o. female, who is being followed for persistent asthma as well as allergic rhinitis and eczema. Her previous allergy office visit was in August 2020 with Gareth Morgan, [place].  At that time, she was started on Derma-Smoothe for her scalp.  She did decide to start [place].  She was continued on triamcinolone ointment twice daily as well as [place].  She was also continued on hydroxyzine 25 mg once at night to decrease itching.  For her asthma, she was continued on montelukast as well as albuterol with Flovent added during respiratory flares.  For her allergic rhinitis, she was continued on Claritin as well as Flonase.  Since the last visit, she has mostly done well.  Unfortunately because of her previous insurance coverage, she was unable to afford quite a few of her medications.  She has limited the use of her medications because of this.  Asthma/Respiratory Symptom History: Her breathing is "so-so". Every now and  then she will have some shortness of breath. However, the mask does not seem to be helping. She does have the Symbicort sample and she takes that is OK. She estimates that she has the shortness of breath around two days per week. While she has not needed ER visits or prednisone courses, she has felt her asthma nearly daily.  She is open to other ideas and recently got different insurance.  Allergic Rhinitis Symptom History: She has itchy watery eyes occasionally and occasional rhinorrhea. This is not a big problem for her. She has never been on allergen immunotherapy.  She has not needed antibiotics.  Food Allergy Symptom History: She deals with her pollen food syndrome by avoidance of certain foods.  She does not have an epinephrine autoinjector.  Eczema Symptom Symptom History: She has done well with the [place] and this has instantly improved her symptoms. She does still get some splotches here and there.   Otherwise, there have been no changes to her past medical history, surgical history, family history, or social history.  Assessment and Plan:  Onisha is a 29 y.o. female with:  Moderate persistent asthma  Perennial and seasonal allergic rhinitis (grasses, weeds, trees, dust mites)  Intrinsic atopic dermatitis  Pollen-food allergy syndrome   1. Moderate persistent asthma, uncomplicated - Lung function deferred today. - I think they need to be on a daily medication because you are feeling the shortness of breath every day. - We are going to add on Breo 100/25 mcg 1 puff once daily to see if this provides relief. - I believe that there  is a co-pay card with this. - You can pick this up next Monday at [facility] and I will be happy to meet you outside even during her lunch hour so that you can pick it up. - Daily controller medication(s): Breo 100/38mcg one puff once daily - Prior to physical activity: ProAir 2 puffs 10-15 minutes before physical activity. - Rescue medications:  ProAir 4 puffs every 4-6 hours as needed - Asthma control goals:  * Full participation in all desired activities (may need albuterol before activity) * Albuterol use two time or less a week on average (not counting use with activity) * Cough interfering with sleep two time or less a month * Oral steroids no more than once a year * No hospitalizations  2. Chronic rhinitis (grasses, weeds, trees, dust mites) - Continue with an antihistamine as needed. - Consider allergen immunotherapy in the future.  3. Intrinsic atopic dermatitis - better controlled on Dupixent - Continue with moisturizing 2-3 times daily as you are doing. - Continue with Dupixent every 2 weeks.  4. Pollen-food allergy syndrome - Continue to avoid your triggering foods. - Use antihistamine as needed.   5. Return in about 6 months (around 08/28/2019). This can be an in-person, a virtual Webex or a telephone follow up visit.  Diagnostics: None.  Medication List:  Current Outpatient Medications  Medication Sig Dispense Refill  . albuterol (PROAIR HFA) 108 (90 Base) MCG/ACT inhaler Inhale 2 puffs into the lungs every 4 (four) hours as needed for wheezing or shortness of breath. 1 Inhaler 2  . DUPIXENT 300 MG/2ML prefilled syringe INJECT 2 SYRINGES SUBCUTANEOUSLY ON DAY 1, THEN INJECT 1 SYRINGE ON DAY 15 , THEN 1 SYRINGE EVERY OTHER WEEK THEREAFTER. 2 mL 12  . hydrOXYzine (ATARAX/VISTARIL) 25 MG tablet Take 1 tablet (25 mg total) by mouth 3 (three) times daily as needed. 30 tablet 5  . montelukast (SINGULAIR) 10 MG tablet Take 1 tablet (10 mg total) by mouth at bedtime. 30 tablet 5  . norethindrone-ethinyl estradiol (LOESTRIN FE) 1-20 MG-MCG tablet Take 1 tablet by mouth daily.    . valACYclovir (VALTREX) 500 MG tablet Take 500 mg by mouth as needed.    . budesonide-formoterol (SYMBICORT) 80-4.5 MCG/ACT inhaler Inhale 2 puffs into the lungs 2 (two) times daily. (Patient not taking: Reported on 09/26/2018) 1 Inhaler 5  .  Crisaborole (EUCRISA) 2 % OINT Apply 1 application topically 2 (two) times daily as needed. (Patient not taking: Reported on 02/28/2019) 60 g 3  . EPINEPHrine (AUVI-Q) 0.3 mg/0.3 mL IJ SOAJ injection Use as directed for severe allergic reactions (Patient not taking: Reported on 02/28/2019) 2 each 1  . fluticasone (FLOVENT HFA) 110 MCG/ACT inhaler Inhale 2 puffs into the lungs 2 (two) times daily. For asthma flare, use 2 puffs twice a day with a spacer for 2 weeks or until cough and wheeze free (Patient not taking: Reported on 02/28/2019) 1 Inhaler 5  . triamcinolone ointment (KENALOG) 0.1 % Apply sparingly to affected areas twice daily as needed below face and neck (Patient not taking: Reported on 02/28/2019) 30 g 3   No current facility-administered medications for this visit.   Allergies: No Known Allergies I reviewed her past medical history, social history, family history, and environmental history and no significant changes have been reported from previous visits.  Review of Systems  Constitutional: Negative for activity change, appetite change, chills, fatigue and fever.  HENT: Negative for congestion, postnasal drip, rhinorrhea, sinus pressure and sore throat.  Eyes: Negative for pain, discharge, redness and itching.  Respiratory: Positive for cough and shortness of breath. Negative for wheezing and stridor.   Gastrointestinal: Negative for diarrhea, nausea and vomiting.  Endocrine: Negative for cold intolerance.  Musculoskeletal: Negative for arthralgias, joint swelling and myalgias.  Skin: Negative for rash.  Allergic/Immunologic: Positive for environmental allergies and food allergies.    Objective:  Physical exam not obtained as encounter was done via telephone.   Previous notes and tests were reviewed.  I discussed the assessment and treatment plan with the patient. The patient was provided an opportunity to ask questions and all were answered. The patient agreed with the plan  and demonstrated an understanding of the instructions.   The patient was advised to call back or seek an in-person evaluation if the symptoms worsen or if the condition fails to improve as anticipated.  I provided 23 minutes of non-face-to-face time during this encounter.  It was my pleasure to participate in [place] care today. Please feel free to contact me with any questions or concerns.   Sincerely,  Alfonse Spruce, MD

## 2019-02-28 NOTE — Patient Instructions (Addendum)
1. Moderate persistent asthma, uncomplicated - Lung function deferred today. - I think they need to be on a daily medication because you are feeling the shortness of breath every day. - We are going to add on Breo 100/25 mcg 1 puff once daily to see if this provides relief. - I believe that there is a co-pay card with this. - You can pick this up next Monday at [facility] and I will be happy to meet you outside even during her lunch hour so that you can pick it up. - Daily controller medication(s): Breo 100/74mcg one puff once daily - Prior to physical activity: ProAir 2 puffs 10-15 minutes before physical activity. - Rescue medications: ProAir 4 puffs every 4-6 hours as needed - Asthma control goals:  * Full participation in all desired activities (may need albuterol before activity) * Albuterol use two time or less a week on average (not counting use with activity) * Cough interfering with sleep two time or less a month * Oral steroids no more than once a year * No hospitalizations  2. Chronic rhinitis (grasses, weeds, trees, dust mites) - Continue with an antihistamine as needed. - Consider allergen immunotherapy in the future.  3. Intrinsic atopic dermatitis - better controlled on Dupixent - Continue with moisturizing 2-3 times daily as you are doing. - Continue with Dupixent every 2 weeks.  4. Pollen-food allergy syndrome - Continue to avoid your triggering foods. - Use antihistamine as needed.   5. Return in about 6 months (around 08/28/2019). This can be an in-person, a virtual Webex or a telephone follow up visit.   Please inform us of any Emergency Department visits, hospitalizations, or changes in symptoms. Call us before going to the ED for breathing or allergy symptoms since we might be able to fit you in for a sick visit. Feel free to contact us anytime with any questions, problems, or concerns.  It was a pleasure to talk to you today today!  Websites that have reliable  patient information: 1. American Academy of Asthma, Allergy, and Immunology: www.aaaai.org 2. Food Allergy Research and Education (FARE): foodallergy.org 3. Mothers of Asthmatics: http://www.asthmacommunitynetwork.org 4. American College of Allergy, Asthma, and Immunology: www.acaai.org   COVID-19 Vaccine Information can be found at: PodExchange.nl For questions related to vaccine distribution or appointments, please email vaccine@[email or web address] .[email or web address] or call [phone].     "Like" [place] on Facebook and Instagram for our latest updates!        Make sure you are registered to vote! If you have moved or changed any of your contact information, you will need to get this updated before voting!  In some cases, you MAY be able to register to vote online: [email or web address]

## 2019-03-04 ENCOUNTER — Telehealth: Payer: Self-pay

## 2019-03-04 NOTE — Telephone Encounter (Signed)
Called and reminded patient to pick up a sample of Breo from [employer].

## 2019-04-08 ENCOUNTER — Telehealth: Payer: Self-pay | Admitting: Allergy & Immunology

## 2019-04-08 NOTE — Telephone Encounter (Signed)
Due to patients change of insurance she is needing her information sent over to [facility] allergy [facility] Ast[facility] for her Wednesday 04/10/19 appointment.  [facility] fax number is [phone]

## 2019-04-08 NOTE — Telephone Encounter (Signed)
Pt. Calling back and unable to come into the office to sign consent forms. Will fax consent forms for pt. To sign and fax back.

## 2019-04-08 NOTE — Telephone Encounter (Signed)
Left pt. A message to come by the office to sign a release form so we can fax over her office notes to [facility] . Pt.'s appointment is 04/10/2019.  [facility] fax number is [phone]

## 2021-01-13 ENCOUNTER — Ambulatory Visit (INDEPENDENT_AMBULATORY_CARE_PROVIDER_SITE_OTHER): Payer: BC Managed Care – PPO | Admitting: Family Medicine

## 2021-01-13 ENCOUNTER — Encounter: Payer: Self-pay | Admitting: Family Medicine

## 2021-01-13 ENCOUNTER — Other Ambulatory Visit: Payer: Self-pay

## 2021-01-13 VITALS — BP 118/60 | HR 80 | Temp 97.6°F | Resp 16 | Ht 66.0 in | Wt 175.8 lb

## 2021-01-13 DIAGNOSIS — J4540 Moderate persistent asthma, uncomplicated: Secondary | ICD-10-CM

## 2021-01-13 DIAGNOSIS — J3089 Other allergic rhinitis: Secondary | ICD-10-CM

## 2021-01-13 DIAGNOSIS — L2084 Intrinsic (allergic) eczema: Secondary | ICD-10-CM | POA: Diagnosis not present

## 2021-01-13 DIAGNOSIS — K219 Gastro-esophageal reflux disease without esophagitis: Secondary | ICD-10-CM | POA: Diagnosis not present

## 2021-01-13 DIAGNOSIS — T781XXD Other adverse food reactions, not elsewhere classified, subsequent encounter: Secondary | ICD-10-CM

## 2021-01-13 DIAGNOSIS — J302 Other seasonal allergic rhinitis: Secondary | ICD-10-CM

## 2021-01-13 MED ORDER — DESONIDE 0.05 % EX OINT: 1.0000 "application " | TOPICAL_OINTMENT | Freq: Two times a day (BID) | CUTANEOUS | 3 refills | Status: DC

## 2021-01-13 MED ORDER — ARMONAIR DIGIHALER 113 MCG/ACT IN AEPB: INHALATION_SPRAY | RESPIRATORY_TRACT | 5 refills | Status: DC

## 2021-01-13 MED ORDER — MONTELUKAST SODIUM 10 MG PO TABS: 10.0000 mg | ORAL_TABLET | Freq: Every day | ORAL | 1 refills | Status: DC

## 2021-01-13 MED ORDER — FLUTICASONE PROPIONATE 50 MCG/ACT NA SUSP: NASAL | 5 refills | Status: DC

## 2021-01-13 MED ORDER — CETIRIZINE HCL 10 MG PO CAPS: ORAL_CAPSULE | ORAL | 1 refills | Status: DC

## 2021-01-13 MED ORDER — TRIAMCINOLONE ACETONIDE 0.1 % EX OINT
1.0000 | TOPICAL_OINTMENT | Freq: Two times a day (BID) | CUTANEOUS | 3 refills | Status: DC
Start: 2021-01-13 — End: 2021-06-02

## 2021-01-13 MED ORDER — ALBUTEROL SULFATE HFA 108 (90 BASE) MCG/ACT IN AERS: 2.0000 | INHALATION_SPRAY | RESPIRATORY_TRACT | 1 refills | Status: DC | PRN

## 2021-01-13 NOTE — Patient Instructions (Addendum)
Asthma Begin ArmonAir 113-1 puff twice a day to prevent cough or wheeze Restart montelukast 10 mg once a day to prevent cough or wheeze Continue albuterol 2 puffs once every 4 hours as needed for cough or wheeze You may use albuterol 5 to 15 minutes before activity to decrease cough or wheeze  Allergic rhinitis Continue allergen avoidance measures Continue cetirizine 10 mg once a day as needed for runny nose or itch we will submit the appropriate paperwork to restart Dupixent. You will next hear from our biologics coordinator, Tammy, about next steps to take with Dupixent Continue Flonase 2 sprays in each nostril once a day as needed for stuffy nose. In the right nostril, point the applicator out toward the right ear. In the left nostril, point the applicator out toward the left ear Consider saline nasal rinses as needed for nasal symptoms. Use this before any medicated nasal sprays for best result  Atopic dermatitis Continue with twice a day moisturizing routine We will submit the appropriate paperwork to restart your Dupixent injections Begin triamcinolone 0.1% ointment to red and itchy areas below your face up to twice a day as needed Begin desonide 0.05% ointment to red itchy areas on your face twice a day as needed Do not use medicated ointments for longer than 2 weeks in a row  Oral allergy syndrome Continue to avoid foods that bother your mouth  Call the clinic if this treatment plan is not working well for you.  Follow up in 3 months or sooner if needed.

## 2021-01-13 NOTE — Progress Notes (Signed)
[place] [id number] Dept: [phone]  FOLLOW UP NOTE  Patient ID: Theresa Le, female    DOB: [birth year]  Age: 29 y.o. MRN: [id number] Date of Office Visit: 01/13/2021  Assessment  Chief Complaint: Asthma and Eczema  HPI Theresa Le is a 30 year old female who presents the clinic for follow-up visit.  She was last seen in this clinic on 02/28/2019 by Dr. Ernst Bowler for evaluation of asthma, allergic rhinitis, atopic dermatitis, and oral allergy syndrome.  In the interim, she stopped receiving Dupixent injections when she became pregnant in 2021 and is interested in restarting Dupixent injections for atopic dermatitis control. She is not currently pregnant or breastfeeding.  At today's visit, she reports her asthma has been moderately well controlled with symptoms that began several months ago including shortness of breath with rest and activity, occasional wheeze mostly occurring at night during cold weather, and occasional cough which is producing mucus.  She has previously taken montelukast 10 mg once a day, albuterol as needed, and a myriad of asthma maintenance inhalers including Flovent, Symbicort, and Breo.  She is not currently using any asthma medications.  Allergic rhinitis is reported as moderately well controlled with symptoms including clear rhinorrhea, nasal congestion, occasional sneeze, and postnasal drainage with frequent throat clearing.  She continues cetirizine on most days with relief of symptoms.  She is not currently using Flonase or nasal saline rinses.  Reflux is reported as moderately well controlled with occasional heartburn.  She has taken pantoprazole previously, however, is not taking any medication to control reflux at this time.  Atopic dermatitis is reported as poorly controlled with red and itchy areas occurring anywhere on her body.  She reports these occur in a flare in remission pattern for which she is currently using Eucerin.  She has received  Dupixent with almost full relief of atopic dermatitis in the past.  She continues to experience mouth and lip tingling while eating some fresh fruits.  She has not experienced symptoms of throat closing, shortness of breath, hives, or diarrhea with these fruits.  Her current medications are listed in the chart.   Drug Allergies:  Allergies  Allergen Reactions   Other Hives and Shortness Of Breath   Peanut-Containing Drug Products Anaphylaxis   Metronidazole Nausea Only   Prednisolone Nausea Only    Physical Exam: BP 118/60 (BP Location: Right Arm, Patient Position: Sitting, Cuff Size: Normal)    Pulse 80    Temp 97.6 F (36.4 C) (Temporal)    Resp 16    Ht $R'5\' 6"'Uj$  (1.676 m)    Wt 175 lb 12.8 oz (79.7 kg)    SpO2 100%    BMI 28.37 kg/m    Physical Exam Vitals reviewed.  Constitutional:      Appearance: Normal appearance.  HENT:     Head: Normocephalic and atraumatic.     Right Ear: Tympanic membrane normal.     Left Ear: Tympanic membrane normal.     Nose:     Comments: Bilateral nares edematous and pale with clear nasal drainage noted.  Pharynx normal.  Ears normal.  Eyes normal.    Mouth/Throat:     Pharynx: Oropharynx is clear.  Eyes:     Conjunctiva/sclera: Conjunctivae normal.  Cardiovascular:     Rate and Rhythm: Normal rate and regular rhythm.     Heart sounds: Normal heart sounds. No murmur heard. Pulmonary:     Effort: Pulmonary effort is normal.     Breath  sounds: Normal breath sounds.     Comments: Lungs clear to auscultation Musculoskeletal:        General: Normal range of motion.     Cervical back: Normal range of motion and neck supple.  Skin:    General: Skin is warm and dry.     Comments: Dry hyperpigmented areas in bilateral antecubital fossa areas.  Slightly hypopigmented areas scattered on her face.  No open areas or drainage noted.  Neurological:     Mental Status: She is alert and oriented to person, place, and time.  Psychiatric:        Mood and  Affect: Mood normal.        Behavior: Behavior normal.        Thought Content: Thought content normal.        Judgment: Judgment normal.    Diagnostics: FVC 2.78, FEV1 2.56.  Predicted FVC 3.48, predicted FEV1 2.94.  Spirometry indicates normal ventilatory function.  Assessment and Plan: 1. Not well controlled moderate persistent asthma   2. Intrinsic atopic dermatitis   3. Seasonal and perennial allergic rhinitis   4. Pollen-food allergy syndrome   5. Gastroesophageal reflux disease, unspecified whether esophagitis present     Meds ordered this encounter  Medications   albuterol (VENTOLIN HFA) 108 (90 Base) MCG/ACT inhaler    Sig: Inhale 2 puffs into the lungs every 4 (four) hours as needed for wheezing or shortness of breath.    Dispense:  1 each    Refill:  1   Cetirizine HCl 10 MG CAPS    Sig: Take 1 pill by mouth daily as needed    Dispense:  90 capsule    Refill:  1    Please dispense 90 day supply   fluticasone (FLONASE) 50 MCG/ACT nasal spray    Sig: 2 puffs once a day as needed for stuffy nose.    Dispense:  16 mL    Refill:  5   montelukast (SINGULAIR) 10 MG tablet    Sig: Take 1 tablet (10 mg total) by mouth at bedtime. Take 1 tablet by mouth once at night for cough or wheeze.    Dispense:  90 tablet    Refill:  1    Dispense 90 day supply   triamcinolone ointment (KENALOG) 0.1 %    Sig: Apply 1 application topically 2 (two) times daily. Apply twice daily to red itchy areas below the face.    Dispense:  45 g    Refill:  3   Fluticasone Propionate,sensor, (ARMONAIR DIGIHALER) 113 MCG/ACT AEPB    Sig: One puff twice a day to prevent coughing or wheezing.    Dispense:  1 each    Refill:  5   desonide (DESOWEN) 0.05 % ointment    Sig: Apply 1 application topically 2 (two) times daily. Apply twice daily to red itchy areas.    Dispense:  30 g    Refill:  3     Patient Instructions  Asthma Begin ArmonAir 113-1 puff twice a day to prevent cough or  wheeze Restart montelukast 10 mg once a day to prevent cough or wheeze Continue albuterol 2 puffs once every 4 hours as needed for cough or wheeze You may use albuterol 5 to 15 minutes before activity to decrease cough or wheeze  Allergic rhinitis Continue allergen avoidance measures Continue cetirizine 10 mg once a day as needed for runny nose or itch we will submit the appropriate paperwork to restart Dupixent. You will next  hear from our biologics coordinator, Tammy, about next steps to take with Dupixent Continue Flonase 2 sprays in each nostril once a day as needed for stuffy nose. In the right nostril, point the applicator out toward the right ear. In the left nostril, point the applicator out toward the left ear Consider saline nasal rinses as needed for nasal symptoms. Use this before any medicated nasal sprays for best result  Atopic dermatitis Continue with twice a day moisturizing routine We will submit the appropriate paperwork to restart your Dupixent injections Begin triamcinolone 0.1% ointment to red and itchy areas below your face up to twice a day as needed Begin desonide 0.05% ointment to red itchy areas on your face twice a day as needed Do not use medicated ointments for longer than 2 weeks in a row  Oral allergy syndrome Continue to avoid foods that bother your mouth  Call the clinic if this treatment plan is not working well for you.  Follow up in 3 months or sooner if needed.  Return in about 3 months (around 04/13/2021), or if symptoms worsen or fail to improve.    Thank you for the opportunity to care for this patient.  Please do not hesitate to contact me with questions.  Gareth Morgan, FNP [facility]

## 2021-01-27 ENCOUNTER — Telehealth: Payer: Self-pay

## 2021-01-27 NOTE — Telephone Encounter (Signed)
Can you please let this patient know that I have put in a communication with Tammy.

## 2021-01-27 NOTE — Telephone Encounter (Signed)
I have started patient approval but [employer] requires topical calcineurin inhibitor trial and failure within last six months and documentation of same so I am sure they are going to deny same

## 2021-01-27 NOTE — Telephone Encounter (Signed)
Pt called and was wondering where he dupixent was as she was seen recently and the dupixent was suppose to be reordered

## 2021-01-27 NOTE — Telephone Encounter (Signed)
OK. I'll start the calcineurin inhibitor now. Thank you!!

## 2021-01-28 ENCOUNTER — Telehealth: Payer: Self-pay | Admitting: Family Medicine

## 2021-01-28 MED ORDER — PIMECROLIMUS 1 % EX CREA: TOPICAL_CREAM | Freq: Two times a day (BID) | CUTANEOUS | 0 refills | Status: DC

## 2021-01-28 NOTE — Telephone Encounter (Signed)
Patient notified of insurance requirement to begin calcineurin inhibitor.  She will pick this up at the pharmacy.  She will call with any questions.  Follow-up in 1 month.

## 2021-02-17 ENCOUNTER — Telehealth: Payer: Self-pay | Admitting: Family Medicine

## 2021-02-17 NOTE — Telephone Encounter (Signed)
Late entry for 01/28/2021 As per insurance request prior to [employer] approval patient has been ordered Protopic.  She agrees to use this medication and report back to our clinic in approximately 1 month at which time we will assess the efficacy of Protopic.  She will call the clinic with any questions.

## 2021-04-13 NOTE — Telephone Encounter (Signed)
Patient called and states prototic did not help at all. She is still having issues with atopic dermatitis. Patient would like to know if we can resubmit for Dupixent. Please advice  ?

## 2021-04-19 NOTE — Telephone Encounter (Signed)
Please resubmit for Dupixent. Thank you

## 2021-04-19 NOTE — Telephone Encounter (Signed)
Tammy please help. Thank you ?

## 2021-04-20 NOTE — Telephone Encounter (Signed)
L/m for patient that needs to make MD appt to get documentation for Ins that she has failed topical therapies since her last visit she was not on topicals and was restarted on same ?

## 2021-06-01 NOTE — Patient Instructions (Addendum)
Asthma ?Restart ArmonAir 113-1 puff twice a day to prevent cough or wheeze ?Restart montelukast 10 mg once a day to prevent cough or wheeze ?Continue albuterol 2 puffs once every 4 hours as needed for cough or wheeze ?You may use albuterol 5 to 15 minutes before activity to decrease cough or wheeze ? ?Allergic rhinitis ?Continue allergen avoidance measures ?Continue cetirizine 10 mg once a day as needed for runny nose or itch  ?Continue Flonase 2 sprays in each nostril once a day as needed for stuffy nose. In the right nostril, point the applicator out toward the right ear. In the left nostril, point the applicator out toward the left ear ?Consider saline nasal rinses as needed for nasal symptoms. Use this before any medicated nasal sprays for best result ?Consider updating your environmental allergy testing.  Remember to stop antihistamines for 3 days before your testing appointment. ? ?Atopic dermatitis ?Continue with twice a day moisturizing routine ?We will submit the appropriate paperwork to restart your Dupixent injections ?Continue triamcinolone 0.1% ointment to red and itchy areas below your face up to twice a day as needed ?Continue desonide 0.05% ointment to red itchy areas on your face twice a day as needed ?Do not use medicated ointments for longer than 2 weeks in a row ?Continue Elidel 1% cream twice a day to control atopic dermatitis ? ?Oral allergy syndrome ?Continue to avoid foods that bother your mouth ? ?Reflux ?Continue dietary and lifestyle modifications as listed below ?Begin omeprazole 20 mg once a day to prevent reflux.  This may help reduce your asthma symptoms ? ?Call the clinic if this treatment plan is not working well for you. ? ?Follow up in 3 months or sooner if needed. ?

## 2021-06-01 NOTE — Progress Notes (Signed)
? ?[address] ?[place] [address] ?Dept: [phone] ? ?FOLLOW UP NOTE ? ?Patient ID: Theresa Le, female    DOB: March 22, 1990  Age: 31 y.o. MRN: [id number] ?Date of Office Visit: 06/02/2021 ? ?Assessment  ?Chief Complaint: Follow-up (Pt she states that she's been well, she may have occasional SOB during certain activities. She states she haven't been complaint with her treatments as prescribed. Pt mentioned she will like to restart Dupixent.) and Dry Eye ? ?HPI ?Theresa Le is a 31 year old female who presents the clinic for follow-up visit.  She was last seen in this clinic on 01/13/2021 by Thermon Leyland, FNP, for evaluation of asthma, allergic rhinitis, atopic dermatitis, oral allergy syndrome, and reflux.  At today's visit, she reports her asthma has been moderately well controlled with intermittent episodes of shortness of breath which is not worse with activity, wheezing occurring in the daytime and nighttime, and intermittent cough producing clear thick mucus.  She continues montelukast 10 mg once a day and rarely uses ArmonAir 113.  She has not used her rescue inhaler since her last visit to this clinic.  Allergic rhinitis is reported as moderately well controlled with symptoms including occasional clear to yellow drainage, frequent nasal congestion, occasional sneezing, and frequent postnasal drainage.  She continues cetirizine 10 mg on most days and is not currently using a nasal steroid spray or nasal saline spray.  No recent environmental allergy testing is available for review.  She reports her reflux has been poorly controlled with symptoms of heartburn occurring about 3 nights a week for which she is not currently taking any medications.  She has previously taken Protonix with relief of symptoms.  She continues to avoid fresh fruits and some fresh vegetables that bother her mouth including pineapple and cucumber.  Atopic dermatitis is reported as poorly controlled with symptoms occurring in a  flare in remission pattern mostly on her hands with the left hand more affected than the right.  She reports symptoms including red, itchy, hyperpigmented areas that begin to scale and crack on occasion.  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  She also reports dry eczematous areas that occur on her legs and back as well.  She continues a daily moisturizing routine, Elidel, and has used a combination of triamcinolone and desonide.  She has previously received Dupixent injections with relief of symptoms.  Her current medications are listed in the chart. ? ? ?Drug Allergies:  ?Allergies  ?Allergen Reactions  ? Other Hives and Shortness Of Breath  ? Peanut-Containing Drug Products Anaphylaxis  ? Metronidazole Nausea Only  ? Prednisolone Nausea Only  ? ? ?Physical Exam: ?Temp 98 ?F (36.7 ?C) (Temporal)   ? ?Physical Exam ?Vitals reviewed.  ?Constitutional:   ?   Appearance: Normal appearance.  ?HENT:  ?   Head: Normocephalic and atraumatic.  ?   Right Ear: Tympanic membrane normal.  ?   Left Ear: Tympanic membrane normal.  ?   Nose:  ?   Comments: Bilateral nares slightly erythematous with clear nasal drainage noted.  Pharynx normal.  Ears normal.  Eyes normal. ?   Mouth/Throat:  ?   Pharynx: Oropharynx is clear.  ?Eyes:  ?   Conjunctiva/sclera: Conjunctivae normal.  ?Cardiovascular:  ?   Rate and Rhythm: Normal rate and regular rhythm.  ?   Heart sounds: Normal heart sounds. No murmur heard. ?Pulmonary:  ?   Effort: Pulmonary effort is normal.  ?  Breath sounds: Normal breath sounds.  ?   Comments: Lungs clear to auscultation ?Musculoskeletal:     ?   General: Normal range of motion.  ?   Cervical back: Normal range of motion and neck supple.  ?Skin: ?   General: Skin is warm.  ?   Comments: Dry patches noted on the palmar surface of both hands.  1 slightly hyperpigmented area noted on the left palmar surface.  No open areas or drainage noted  ?Neurological:  ?    Mental Status: She is alert.  ? ? ?Diagnostics: ?FVC 2.96, FEV1 2.50.  Predicted FVC 3.48, predicted FEV1 2.94.  Spirometry indicates normal ventilatory function. ? ?Assessment and Plan: ?1. Not well controlled moderate persistent asthma   ?2. Pollen-food allergy syndrome   ?3. Intrinsic atopic dermatitis   ?4. Gastroesophageal reflux disease, unspecified whether esophagitis present   ?5. Seasonal and perennial allergic rhinitis   ? ? ?Meds ordered this encounter  ?Medications  ? albuterol (VENTOLIN HFA) 108 (90 Base) MCG/ACT inhaler  ?  Sig: Inhale 2 puffs into the lungs every 4 (four) hours as needed for wheezing or shortness of breath.  ?  Dispense:  1 each  ?  Refill:  1  ? Cetirizine HCl 10 MG CAPS  ?  Sig: Take 1 pill by mouth daily as needed  ?  Dispense:  90 capsule  ?  Refill:  1  ?  Please dispense 90 day supply  ? fluticasone (FLONASE) 50 MCG/ACT nasal spray  ?  Sig: 2 puffs once a day as needed for stuffy nose.  ?  Dispense:  16 mL  ?  Refill:  5  ? Fluticasone Propionate,sensor, (ARMONAIR DIGIHALER) 113 MCG/ACT AEPB  ?  Sig: One puff twice a day to prevent coughing or wheezing.  ?  Dispense:  1 each  ?  Refill:  5  ? montelukast (SINGULAIR) 10 MG tablet  ?  Sig: Take 1 tablet (10 mg total) by mouth at bedtime. Take 1 tablet by mouth once at night for cough or wheeze.  ?  Dispense:  90 tablet  ?  Refill:  1  ?  Dispense 90 day supply  ? Crisaborole (EUCRISA) 2 % OINT  ?  Sig: Apply 1 application. topically 2 (two) times daily as needed.  ?  Dispense:  60 g  ?  Refill:  3  ? desonide (DESOWEN) 0.05 % ointment  ?  Sig: Apply 1 application. topically 2 (two) times daily. Apply twice daily to red itchy areas.  ?  Dispense:  30 g  ?  Refill:  3  ? pimecrolimus (ELIDEL) 1 % cream  ?  Sig: Apply topically 2 (two) times daily.  ?  Dispense:  30 g  ?  Refill:  0  ? omeprazole (PRILOSEC) 20 MG capsule  ?  Sig: Take 1 capsule (20 mg total) by mouth daily.  ?  Dispense:  30 capsule  ?  Refill:  5  ? triamcinolone  ointment (KENALOG) 0.1 %  ?  Sig: Apply 1 application. topically 2 (two) times daily.  ?  Dispense:  453.6 g  ?  Refill:  1  ? ? ?Patient Instructions  ?Asthma ?Restart ArmonAir 113-1 puff twice a day to prevent cough or wheeze ?Restart montelukast 10 mg once a day to prevent cough or wheeze ?Continue albuterol 2 puffs once every 4 hours as needed for cough or wheeze ?You may use albuterol 5 to 15 minutes before activity to  decrease cough or wheeze ? ?Allergic rhinitis ?Continue allergen avoidance measures ?Continue cetirizine 10 mg once a day as needed for runny nose or itch  ?Continue Flonase 2 sprays in each nostril once a day as needed for stuffy nose. In the right nostril, point the applicator out toward the right ear. In the left nostril, point the applicator out toward the left ear ?Consider saline nasal rinses as needed for nasal symptoms. Use this before any medicated nasal sprays for best result ?Consider updating your environmental allergy testing.  Remember to stop antihistamines for 3 days before your testing appointment. ? ?Atopic dermatitis ?Continue with twice a day moisturizing routine ?We will submit the appropriate paperwork to restart your Dupixent injections ?Continue triamcinolone 0.1% ointment to red and itchy areas below your face up to twice a day as needed ?Continue desonide 0.05% ointment to red itchy areas on your face twice a day as needed ?Do not use medicated ointments for longer than 2 weeks in a row ?Continue Elidel 1% cream twice a day to control atopic dermatitis ? ?Oral allergy syndrome ?Continue to avoid foods that bother your mouth ? ?Reflux ?Continue dietary and lifestyle modifications as listed below ?Begin omeprazole 20 mg once a day to prevent reflux.  This may help reduce your asthma symptoms ? ?Call the clinic if this treatment plan is not working well for you. ? ?Follow up in 3 months or sooner if needed. ? ?Return in about 3 months (around 09/02/2021), or if symptoms  worsen or fail to improve. ?  ? ?Thank you for the opportunity to care for this patient.  Please do not hesitate to contact me with questions. ? ?Thermon Leyland, FNP ?[facility] ? ?

## 2021-06-02 ENCOUNTER — Encounter: Payer: Self-pay | Admitting: Family Medicine

## 2021-06-02 ENCOUNTER — Other Ambulatory Visit: Payer: Self-pay

## 2021-06-02 ENCOUNTER — Ambulatory Visit: Payer: BC Managed Care – PPO | Admitting: Family Medicine

## 2021-06-02 VITALS — BP 118/72 | HR 78 | Temp 98.0°F | Resp 19 | Ht 66.34 in | Wt 188.3 lb

## 2021-06-02 DIAGNOSIS — T781XXD Other adverse food reactions, not elsewhere classified, subsequent encounter: Secondary | ICD-10-CM | POA: Diagnosis not present

## 2021-06-02 DIAGNOSIS — L2084 Intrinsic (allergic) eczema: Secondary | ICD-10-CM

## 2021-06-02 DIAGNOSIS — J302 Other seasonal allergic rhinitis: Secondary | ICD-10-CM

## 2021-06-02 DIAGNOSIS — J4540 Moderate persistent asthma, uncomplicated: Secondary | ICD-10-CM

## 2021-06-02 DIAGNOSIS — K219 Gastro-esophageal reflux disease without esophagitis: Secondary | ICD-10-CM

## 2021-06-02 DIAGNOSIS — J3089 Other allergic rhinitis: Secondary | ICD-10-CM

## 2021-06-02 MED ORDER — PIMECROLIMUS 1 % EX CREA: TOPICAL_CREAM | Freq: Two times a day (BID) | CUTANEOUS | 0 refills | Status: DC

## 2021-06-02 MED ORDER — ARMONAIR DIGIHALER 113 MCG/ACT IN AEPB: INHALATION_SPRAY | RESPIRATORY_TRACT | 5 refills | Status: DC

## 2021-06-02 MED ORDER — ALBUTEROL SULFATE HFA 108 (90 BASE) MCG/ACT IN AERS: 2.0000 | INHALATION_SPRAY | RESPIRATORY_TRACT | 1 refills | Status: DC | PRN

## 2021-06-02 MED ORDER — OMEPRAZOLE 20 MG PO CPDR: 20.0000 mg | DELAYED_RELEASE_CAPSULE | Freq: Every day | ORAL | 5 refills | Status: AC

## 2021-06-02 MED ORDER — FLUTICASONE PROPIONATE 50 MCG/ACT NA SUSP: NASAL | 5 refills | Status: DC

## 2021-06-02 MED ORDER — EUCRISA 2 % EX OINT: 1.0000 "application " | TOPICAL_OINTMENT | Freq: Two times a day (BID) | CUTANEOUS | 3 refills | Status: DC | PRN

## 2021-06-02 MED ORDER — MONTELUKAST SODIUM 10 MG PO TABS: 10.0000 mg | ORAL_TABLET | Freq: Every day | ORAL | 1 refills | Status: DC

## 2021-06-02 MED ORDER — CETIRIZINE HCL 10 MG PO CAPS
ORAL_CAPSULE | ORAL | 1 refills | Status: DC
Start: 2021-06-02 — End: 2023-01-09

## 2021-06-02 MED ORDER — TRIAMCINOLONE ACETONIDE 0.1 % EX OINT: 1.0000 "application " | TOPICAL_OINTMENT | Freq: Two times a day (BID) | CUTANEOUS | 1 refills | Status: DC

## 2021-06-02 MED ORDER — DESONIDE 0.05 % EX OINT: 1.0000 "application " | TOPICAL_OINTMENT | Freq: Two times a day (BID) | CUTANEOUS | 3 refills | Status: DC

## 2021-06-07 ENCOUNTER — Telehealth: Payer: Self-pay | Admitting: *Deleted

## 2021-06-07 MED ORDER — DUPIXENT 300 MG/2ML ~~LOC~~ SOSY: PREFILLED_SYRINGE | SUBCUTANEOUS | 12 refills | Status: DC

## 2021-06-07 NOTE — Telephone Encounter (Signed)
Patient called to check status of her Dupixent and I advised [employer] just approved and will send Rx to [facility] and check on her copay card if still active ?

## 2021-06-07 NOTE — Telephone Encounter (Signed)
Yay!! Thank you!

## 2021-06-07 NOTE — Telephone Encounter (Signed)
-----   Message from Hetty Blend, FNP sent at 06/02/2021 12:47 PM EDT ----- ?Hi there Theresa Le, ?This patient reports that she did not have any improvement with adding Elidel into the treatment for AD. Can you please resubmit for Dupixent? Thank you ?

## 2021-06-16 ENCOUNTER — Other Ambulatory Visit: Payer: Self-pay | Admitting: Family Medicine

## 2021-06-21 ENCOUNTER — Emergency Department (HOSPITAL_BASED_OUTPATIENT_CLINIC_OR_DEPARTMENT_OTHER)
Admission: EM | Admit: 2021-06-21 | Discharge: 2021-06-21 | Disposition: A | Discharge status: Home / Self Care | Payer: BC Managed Care – PPO | Attending: Emergency Medicine | Admitting: Emergency Medicine

## 2021-06-21 ENCOUNTER — Encounter (HOSPITAL_BASED_OUTPATIENT_CLINIC_OR_DEPARTMENT_OTHER): Payer: Self-pay | Admitting: Emergency Medicine

## 2021-06-21 ENCOUNTER — Other Ambulatory Visit: Payer: Self-pay

## 2021-06-21 ENCOUNTER — Emergency Department (HOSPITAL_BASED_OUTPATIENT_CLINIC_OR_DEPARTMENT_OTHER): Payer: BC Managed Care – PPO

## 2021-06-21 DIAGNOSIS — Y92481 Parking lot as the place of occurrence of the external cause: Secondary | ICD-10-CM | POA: Insufficient documentation

## 2021-06-21 DIAGNOSIS — Z91010 Allergy to peanuts: Secondary | ICD-10-CM | POA: Insufficient documentation

## 2021-06-21 DIAGNOSIS — M791 Myalgia, unspecified site: Secondary | ICD-10-CM | POA: Insufficient documentation

## 2021-06-21 LAB — PREGNANCY, URINE: Preg Test, Ur: NEGATIVE

## 2021-06-21 MED ORDER — METHOCARBAMOL 500 MG PO TABS
500.0000 mg | ORAL_TABLET | Freq: Two times a day (BID) | ORAL | 0 refills | Status: DC
Start: 2021-06-21 — End: 2023-01-09
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
## 2021-06-21 NOTE — ED Notes (Signed)
Sprite and warm blankets given for comfort.
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
## 2022-01-05 ENCOUNTER — Other Ambulatory Visit: Payer: Self-pay | Admitting: *Deleted

## 2022-01-05 ENCOUNTER — Other Ambulatory Visit (HOSPITAL_COMMUNITY): Payer: Self-pay

## 2022-01-05 MED ORDER — DUPIXENT 300 MG/2ML ~~LOC~~ SOSY
300.0000 mg | PREFILLED_SYRINGE | SUBCUTANEOUS | 12 refills | Status: DC
  Filled 2022-01-05 – 2022-01-17 (×2): qty 4, 28d supply, fill #0

## 2022-01-05 NOTE — Telephone Encounter (Signed)
L/m for patient due to [facility] pharmacy change from [facility] to [facility]

## 2022-01-07 ENCOUNTER — Other Ambulatory Visit (HOSPITAL_COMMUNITY): Payer: Self-pay

## 2022-01-17 ENCOUNTER — Other Ambulatory Visit (HOSPITAL_COMMUNITY): Payer: Self-pay

## 2022-01-26 ENCOUNTER — Other Ambulatory Visit (HOSPITAL_COMMUNITY): Payer: Self-pay

## 2022-02-11 ENCOUNTER — Other Ambulatory Visit (HOSPITAL_COMMUNITY): Payer: Self-pay

## 2022-03-15 ENCOUNTER — Other Ambulatory Visit (HOSPITAL_COMMUNITY): Payer: Self-pay

## 2022-03-17 ENCOUNTER — Other Ambulatory Visit (HOSPITAL_COMMUNITY): Payer: Self-pay

## 2022-05-04 ENCOUNTER — Other Ambulatory Visit (HOSPITAL_COMMUNITY): Payer: Self-pay

## 2022-07-20 ENCOUNTER — Other Ambulatory Visit: Payer: Self-pay

## 2022-07-20 ENCOUNTER — Ambulatory Visit (INDEPENDENT_AMBULATORY_CARE_PROVIDER_SITE_OTHER): Payer: Managed Care, Other (non HMO) | Admitting: Internal Medicine

## 2022-07-20 ENCOUNTER — Ambulatory Visit: Payer: BC Managed Care – PPO

## 2022-07-20 ENCOUNTER — Encounter: Payer: Self-pay | Admitting: Internal Medicine

## 2022-07-20 VITALS — BP 126/68 | HR 63 | Temp 97.7°F | Resp 20 | Ht 66.0 in | Wt 197.3 lb

## 2022-07-20 DIAGNOSIS — L2084 Intrinsic (allergic) eczema: Secondary | ICD-10-CM | POA: Diagnosis not present

## 2022-07-20 DIAGNOSIS — J3089 Other allergic rhinitis: Secondary | ICD-10-CM | POA: Diagnosis not present

## 2022-07-20 DIAGNOSIS — T781XXD Other adverse food reactions, not elsewhere classified, subsequent encounter: Secondary | ICD-10-CM

## 2022-07-20 DIAGNOSIS — J4530 Mild persistent asthma, uncomplicated: Secondary | ICD-10-CM | POA: Diagnosis not present

## 2022-07-20 DIAGNOSIS — J302 Other seasonal allergic rhinitis: Secondary | ICD-10-CM

## 2022-07-20 MED ORDER — MONTELUKAST SODIUM 10 MG PO TABS: 10.0000 mg | ORAL_TABLET | Freq: Every day | ORAL | 1 refills | Status: DC

## 2022-07-20 MED ORDER — DESONIDE 0.05 % EX OINT: TOPICAL_OINTMENT | CUTANEOUS | 5 refills | Status: DC

## 2022-07-20 MED ORDER — ALBUTEROL SULFATE HFA 108 (90 BASE) MCG/ACT IN AERS
2.0000 | INHALATION_SPRAY | RESPIRATORY_TRACT | 1 refills | Status: AC | PRN
Start: 2022-07-20 — End: ?

## 2022-07-20 MED ORDER — PIMECROLIMUS 1 % EX CREA: TOPICAL_CREAM | Freq: Two times a day (BID) | CUTANEOUS | 5 refills | Status: DC

## 2022-07-20 MED ORDER — TRIAMCINOLONE ACETONIDE 0.1 % EX OINT: TOPICAL_OINTMENT | CUTANEOUS | 1 refills | Status: DC

## 2022-07-20 MED ORDER — FLUTICASONE PROPIONATE 50 MCG/ACT NA SUSP: 2.0000 | Freq: Every day | NASAL | 5 refills | Status: AC

## 2022-07-20 NOTE — Progress Notes (Signed)
FOLLOW UP Date of Service/Encounter:  07/20/22   Subjective:  Theresa Le (DOB: 1990/03/24) is a 32 y.o. female who [facility] for follow up for asthma, allergic rhinitis, eczema, PFAS.    History obtained from: chart review and patient. Last visit was on 06/02/2021 and at the time, discussed restarting Dupixent for eczema (already tried topical steroids, Elidel).  Also on ArmonAir, Singulair, PRN Albuterol.    Eczema: Reports her eczema was doing well on Dupixent but since being off of it around Nov/Dec 2023, she has had frequent flare ups on hands, thighs, face and around lips.  Hands are her biggest issue and many times, they crack and bleed.  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  Using triamcinolone, desonide, Elidel with minimal relief.  She had to stop Dupixent due to high copay but now has a different insurance with better coverage.    Asthma  Doing okay overall.  Her biggest problem is dyspnea and triggers include hot weather.  Using Albuterol 1-2x/week with exertion.  No ER visits or oral prednisone in the last year.  She did go to urgent care about 3 weeks ago with chest pain- did CXR and EKG which were unremarkable.  No other daily inhaler recently.  Allergies: Some whelps on face intermittently and itchy watery eyes when she goes outside.   Was using Zyrtec/Singulair previously but has not used it recently.  Not doing nose sprays.   PFAS: Has trouble with nuts and some fruits causing mouth itching but not avoiding anything completely.      Past Medical History: Past Medical History:  Diagnosis Date   Asthma    Eczema     Objective:  BP 126/68 (BP Location: Right Arm, Patient Position: Sitting, Cuff Size: Normal)   Pulse 63   Temp 97.7 F (36.5 C) (Temporal)   Resp 20   Ht 5\' 6"  (1.676 m)   Wt 197 lb 4.8 oz (89.5 kg)   SpO2 99%   BMI 31.85 kg/m  Body mass index is 31.85 kg/m. Physical Exam: GEN: alert,  well developed HEENT: clear conjunctiva, TM grey and translucent, nose with moderate inferior turbinate hypertrophy, pink nasal mucosa, clear rhinorrhea, + cobblestoning HEART: regular rate and rhythm, no murmur LUNGS: clear to auscultation bilaterally, no coughing, unlabored respiration SKIN: thickened, dry patches on bl hands, thighs, arms  Spirometry:  Tracings reviewed. Her effort: Good reproducible efforts. FVC: 2.99L FEV1: 2.54L, 87% predicted FEV1/FVC ratio: 85% Interpretation: Spirometry consistent with normal pattern.  Please see scanned spirometry results for details.  Assessment:   1. Mild persistent asthma without complication   2. Seasonal and perennial allergic rhinitis   3. Pollen-food allergy syndrome   4. Intrinsic atopic dermatitis     Plan/Recommendations:  Mild Persistent Asthma - Controlled with normal spirometry today.   - Maintenance inhaler: continue Singulair 10mg  daily.  - Rescue inhaler: Albuterol 2 puffs via spacer or 1 vial via nebulizer every 4-6 hours as needed for respiratory symptoms of cough, shortness of breath, or wheezing Asthma control goals:  Full participation in all desired activities (may need albuterol before activity) Albuterol use two times or less a week on average (not counting use with activity) Cough interfering with sleep two times or less a month Oral steroids no more than once a year No hospitalizations   Allergic rhinitis - Controlled - Previous allergy testing with [facility].  If worsening symptoms, can consider retesting.   -  Avoidance measures discussed. - Use nasal saline rinses before nose sprays such as with Neilmed Sinus Rinse.  Use distilled water.   - Use Flonase 2 sprays each nostril daily. Aim upward and outward. - Use Zyrtec 10 mg daily.  - Use Singulair 10mg  daily.  Stop if there are any mood/behavioral changes. - For eyes, use Olopatadine or Ketotifen 1 eye drop daily as needed for itchy, watery eyes.  Available  over the counter, if not covered by insurance.   Atopic dermatitis - Not controlled with frequent flare ups despite topical steroids. Will restart Dupixent - Do a daily soaking tub bath in warm water for 10-15 minutes.  - Use a gentle, unscented cleanser at the end of the bath (such as Dove unscented bar or baby wash, or Aveeno sensitive body wash). Then rinse, pat half-way dry, and apply a gentle, unscented moisturizer cream or ointment (Cerave, Cetaphil, Eucerin, Aveeno)  all over while still damp. Dry skin makes the itching and rash of eczema worse. The skin should be moisturized with a gentle, unscented moisturizer at least twice daily.  - Use only unscented liquid laundry detergent. - Apply prescribed topical steroid (triamcinolone 0.1% below neck or desonide 0.05% above neck) to flared areas (red and thickened eczema) after the moisturizer has soaked into the skin (wait at least 30 minutes). Taper off the topical steroids as the skin improves. Do not use topical steroid for more than 7-10 days at a time.  - Put Elidel onto areas of rough eczema (that is not red) twice a day. May decrease to once a day as the eczema improves. This will not thin the skin, and is safe for chronic use. Do not put this onto normal appearing skin. - Restart Dupixent 600mg  x1 followed by 300mg  every 2 weeks.  I will message Tammy.    Oral allergy syndrome - These symptoms are typically not life-threatening and are because of a cross reaction between a pollen you are allergic to, and to a protein in specific foods (such as fresh fruits, vegetables, and nuts). - If you can eat these things and tolerate the symptoms, it is fine to continue to do so.  If not, you may avoid these fresh fruits and vegetables.   - Heating these foods, buying them canned, and peeling these foods should allow them to be consumed without symptoms or with less symptoms.   Return in about 2 months (around 09/19/2022).  Alesia Morin, MD [facility]

## 2022-07-20 NOTE — Patient Instructions (Addendum)
Moderate Persistent Asthma - Maintenance inhaler: continue Singulair 10mg  daily.  - Rescue inhaler: Albuterol 2 puffs via spacer or 1 vial via nebulizer every 4-6 hours as needed for respiratory symptoms of cough, shortness of breath, or wheezing Asthma control goals:  Full participation in all desired activities (may need albuterol before activity) Albuterol use two times or less a week on average (not counting use with activity) Cough interfering with sleep two times or less a month Oral steroids no more than once a year No hospitalizations   Allergic rhinitis - Previous allergy testing with [facility].  If worsening symptoms, can consider retesting.   - Avoidance measures discussed. - Use nasal saline rinses before nose sprays such as with Neilmed Sinus Rinse.  Use distilled water.   - Use Flonase 2 sprays each nostril daily. Aim upward and outward. - Use Zyrtec 10 mg daily.  - Use Singulair 10mg  daily.  Stop if there are any mood/behavioral changes. - For eyes, use Olopatadine or Ketotifen 1 eye drop daily as needed for itchy, watery eyes.  Available over the counter, if not covered by insurance.   Atopic dermatitis - Do a daily soaking tub bath in warm water for 10-15 minutes.  - Use a gentle, unscented cleanser at the end of the bath (such as Dove unscented bar or baby wash, or Aveeno sensitive body wash). Then rinse, pat half-way dry, and apply a gentle, unscented moisturizer cream or ointment (Cerave, Cetaphil, Eucerin, Aveeno)  all over while still damp. Dry skin makes the itching and rash of eczema worse. The skin should be moisturized with a gentle, unscented moisturizer at least twice daily.  - Use only unscented liquid laundry detergent. - Apply prescribed topical steroid (triamcinolone 0.1% below neck or desonide 0.05% above neck) to flared areas (red and thickened eczema) after the moisturizer has soaked into the skin (wait at least 30 minutes). Taper off the topical steroids as the  skin improves. Do not use topical steroid for more than 7-10 days at a time.  - Put Elidel onto areas of rough eczema (that is not red) twice a day. May decrease to once a day as the eczema improves. This will not thin the skin, and is safe for chronic use. Do not put this onto normal appearing skin. - Restart Dupixent.  I will message Tammy.    Oral allergy syndrome - These symptoms are typically not life-threatening and are because of a cross reaction between a pollen you are allergic to, and to a protein in specific foods (such as fresh fruits, vegetables, and nuts). - If you can eat these things and tolerate the symptoms, it is fine to continue to do so.  If not, you may avoid these fresh fruits and vegetables.   - Heating these foods, buying them canned, and peeling these foods should allow them to be consumed without symptoms or with less symptoms.  Orlean Bradford Follow up in 2 months or sooner if needed.

## 2022-07-21 ENCOUNTER — Other Ambulatory Visit (HOSPITAL_COMMUNITY): Payer: Self-pay

## 2022-07-21 ENCOUNTER — Telehealth: Payer: Self-pay

## 2022-07-21 MED ORDER — PIMECROLIMUS 1 % EX CREA: TOPICAL_CREAM | Freq: Two times a day (BID) | CUTANEOUS | 5 refills | Status: DC

## 2022-07-21 NOTE — Telephone Encounter (Signed)
Patient calling to see if she could receive a sample of dupixent. Patient saw Dr. Allena Katz yesterday and patient is going to restart dupixent. I reached out to Tammy and she said ok to give sample of dupixent it would be a loading dose since she hasn't had dupixent since last year. Patient will receive the medication at home and administer her injection to herself every 2 wks.

## 2022-07-21 NOTE — Telephone Encounter (Signed)
Pharmacy notified of approval...

## 2022-07-21 NOTE — Telephone Encounter (Signed)
Patient Advocate Encounter  Prior Authorization for Pimecrolimus 1% cream has been approved through [facility] [place].    Key: [id number]  Effective: 07-21-2022 to 07-21-2023  Quantity approved 100 grams per 30 days

## 2022-07-21 NOTE — Telephone Encounter (Signed)
Patient Advocate Encounter   Received notification from [facility] that prior authorization is required for Pimecrolimus 1% cream   Submitted: 07-21-2022 Key [id number]  Status is pending

## 2022-07-22 ENCOUNTER — Ambulatory Visit (INDEPENDENT_AMBULATORY_CARE_PROVIDER_SITE_OTHER): Payer: Managed Care, Other (non HMO)

## 2022-07-22 DIAGNOSIS — L209 Atopic dermatitis, unspecified: Secondary | ICD-10-CM | POA: Diagnosis not present

## 2022-07-22 MED ORDER — DUPILUMAB 300 MG/2ML ~~LOC~~ SOSY
600.0000 mg | PREFILLED_SYRINGE | SUBCUTANEOUS | Status: AC
  Administered 2022-07-22: 600 mg via SUBCUTANEOUS

## 2022-07-22 NOTE — Progress Notes (Signed)
Immunotherapy   Patient Details  Name: Theresa Le MRN: [id number] Date of Birth: 1990-12-28  07/22/2022  Orlean Bradford restarted injections for Dupixent 600 mg/87ml loading dose given Frequency: She will continue her injection at home Q 2 weeks. Consent signed and patient instructions given.   Liliana Dang J Axil Copeman 07/22/2022, 10:45 AM

## 2022-08-02 ENCOUNTER — Telehealth: Payer: Self-pay | Admitting: *Deleted

## 2022-08-02 NOTE — Telephone Encounter (Signed)
Called patient and advised approval and submit to [facility] for Dupixent, gave pt number to call to get copay card info to give to pharmacy for $0 copay

## 2022-08-02 NOTE — Telephone Encounter (Signed)
-----   Message from Birder Robson, MD sent at 07/20/2022 12:53 PM EDT ----- Gwenyth Bender I just saw her and would like her to restart Dupixent with loading for her uncontrolled eczema.  Thank you.

## 2022-09-21 ENCOUNTER — Ambulatory Visit: Payer: Managed Care, Other (non HMO) | Admitting: Internal Medicine

## 2022-09-22 ENCOUNTER — Ambulatory Visit: Payer: Managed Care, Other (non HMO) | Admitting: Internal Medicine

## 2023-01-07 NOTE — Patient Instructions (Incomplete)
Moderate Persistent Asthma: Well-controlled Breathing test looks great - Maintenance inhaler: continue Singulair 10mg  daily.  - Rescue inhaler: Albuterol 2 puffs via spacer or 1 vial via nebulizer every 4-6 hours as needed for respiratory symptoms of cough, shortness of breath, or wheezing Asthma control goals:  Full participation in all desired activities (may need albuterol before activity) Albuterol use two times or less a week on average (not counting use with activity) Cough interfering with sleep two times or less a month Oral steroids no more than once a year No hospitalizations   Allergic rhinitis - Previous allergy testing with [facility].  If worsening symptoms, can consider retesting.   - Avoidance measures discussed. - Use nasal saline rinses before nose sprays such as with Neilmed Sinus Rinse.  Use distilled water.   - Use Flonase 2 sprays each nostril daily. Aim upward and outward. - Use Zyrtec 10 mg daily.  - Use Singulair 10mg  daily.  Stop if there are any mood/behavioral changes. - For eyes, use Olopatadine or Ketotifen 1 eye drop daily as needed for itchy, watery eyes.  Available over the counter, if not covered by insurance.   Atopic dermatitis - Do a daily soaking tub bath in warm water for 10-15 minutes.  - Use a gentle, unscented cleanser at the end of the bath (such as Dove unscented bar or baby wash, or Aveeno sensitive body wash). Then rinse, pat half-way dry, and apply a gentle, unscented moisturizer cream or ointment (Cerave, Cetaphil, Eucerin, Aveeno)  all over while still damp. Dry skin makes the itching and rash of eczema worse. The skin should be moisturized with a gentle, unscented moisturizer at least twice daily.  - Use only unscented liquid laundry detergent. - Apply prescribed topical steroid (triamcinolone 0.1% below neck or desonide 0.05% above neck) to flared areas (red and thickened eczema) after the moisturizer has soaked into the skin (wait at least 30  minutes). Taper off the topical steroids as the skin improves. Do not use topical steroid for more than 7-10 days at a time.  - Put Elidel onto areas of rough eczema (that is not red) twice a day. May decrease to once a day as the eczema improves. This will not thin the skin, and is safe for chronic use. Do not put this onto normal appearing skin. - Continue  Dupixent 300 mg injections every 2 weeks at home.      Oral allergy syndrome  Will get updated NUT testing today.  Avoid all nuts until it results  - These symptoms are typically not life-threatening and are because of a cross reaction between a pollen you are allergic to, and to a protein in specific foods (such as fresh fruits, vegetables, and nuts). - If you can eat these things and tolerate the symptoms, it is fine to continue to do so.  If not, you may avoid these fresh fruits and vegetables.   - Heating these foods, buying them canned, and peeling these foods should allow them to be consumed without symptoms or with less symptoms.   Follow up in 6 months or sooner if needed.

## 2023-01-09 ENCOUNTER — Other Ambulatory Visit: Payer: Self-pay

## 2023-01-09 ENCOUNTER — Ambulatory Visit (INDEPENDENT_AMBULATORY_CARE_PROVIDER_SITE_OTHER): Payer: Managed Care, Other (non HMO) | Admitting: Internal Medicine

## 2023-01-09 ENCOUNTER — Encounter: Payer: Self-pay | Admitting: Internal Medicine

## 2023-01-09 VITALS — BP 126/74 | HR 74 | Temp 98.3°F | Resp 20 | Ht 66.0 in | Wt 179.3 lb

## 2023-01-09 DIAGNOSIS — J4530 Mild persistent asthma, uncomplicated: Secondary | ICD-10-CM

## 2023-01-09 DIAGNOSIS — J3089 Other allergic rhinitis: Secondary | ICD-10-CM | POA: Diagnosis not present

## 2023-01-09 DIAGNOSIS — T781XXD Other adverse food reactions, not elsewhere classified, subsequent encounter: Secondary | ICD-10-CM | POA: Diagnosis not present

## 2023-01-09 DIAGNOSIS — L2084 Intrinsic (allergic) eczema: Secondary | ICD-10-CM

## 2023-01-09 DIAGNOSIS — J302 Other seasonal allergic rhinitis: Secondary | ICD-10-CM

## 2023-01-09 MED ORDER — MONTELUKAST SODIUM 10 MG PO TABS
10.0000 mg | ORAL_TABLET | Freq: Every day | ORAL | 1 refills | Status: AC
Start: 2023-01-09 — End: ?

## 2023-01-09 MED ORDER — TRIAMCINOLONE ACETONIDE 0.1 % EX OINT: TOPICAL_OINTMENT | CUTANEOUS | 1 refills | Status: AC

## 2023-01-09 MED ORDER — PIMECROLIMUS 1 % EX CREA
TOPICAL_CREAM | Freq: Two times a day (BID) | CUTANEOUS | 5 refills | Status: AC
Start: 2023-01-09 — End: ?

## 2023-01-09 MED ORDER — DESONIDE 0.05 % EX OINT: TOPICAL_OINTMENT | CUTANEOUS | 5 refills | Status: AC

## 2023-01-09 MED ORDER — CETIRIZINE HCL 10 MG PO CAPS: ORAL_CAPSULE | ORAL | 1 refills | Status: DC

## 2023-01-09 NOTE — Progress Notes (Signed)
FOLLOW UP Date of Service/Encounter:  01/09/23   Subjective:  Theresa Le (DOB: 1990-06-08) is a 32 y.o. female who [facility] for follow up for asthma, allergic rhinitis, eczema, PFAS.    History obtained from: chart review and patient.  Last visit was on 07/20/22 and at the time, restarted Dupixent for eczema (already tried topical steroids, Elidel).  Also on Zyrtec, Flonase, Pataday, Singulair, PRN Albuterol.     The patient, with a history of eczema and asthma, presents with intermittent flares of eczema, occurring a couple of times a month. The flares manifest as patches on the arms, legs, and hands. The patient has been managing these flares with topical steroids, specifically desonide and triamcinolone which she applies when needed. She also reports a recent increase in facial dryness and peeling, particularly around the eyes, which has been occurring approximately once a month. The patient has not identified any specific triggers for these flares.  In addition to the eczema, the patient has been experiencing symptoms suggestive of food allergies. She reports a history of allergy testing, which revealed an allergy to tree nuts. Despite this, the patient has continued to consume products containing tree nuts, such as Snickers bars, with minimal reactions. However, a recent incident involving a Baby Ruth bar resulted in a more severe reaction, including an itchy, scratchy throat, swollen gums, and a swollen tongue. The patient also reports similar reactions when consuming certain fruits and vegetables, which she attributes to food pollen syndrome.  The patient's asthma has been relatively well-controlled, with only occasional use of albuterol and no recent ER visits or need for prednisone. She also reports symptoms of allergies, including a stuffy nose, itchy ears and throat, and sneezing. The patient has been managing these symptoms with Zyrtec  and Flonase.    Past Medical History: Past Medical History:  Diagnosis Date   Asthma    Eczema     Objective:  BP 126/74 (BP Location: Right Arm, Patient Position: Sitting, Cuff Size: Normal)   Pulse 74   Temp 98.3 F (36.8 C) (Temporal)   Resp 20   Ht 5\' 6"  (1.676 m)   Wt 179 lb 4.8 oz (81.3 kg)   SpO2 100%   BMI 28.94 kg/m  Body mass index is 28.94 kg/m. Physical Exam: GEN: alert, well developed HEENT: clear conjunctiva, TM grey and translucent, nose with moderate inferior turbinate hypertrophy, pink nasal mucosa, clear rhinorrhea, + cobblestoning HEART: regular rate and rhythm, no murmur LUNGS: clear to auscultation bilaterally, no coughing, unlabored respiration SKIN: skin turgor normal, no rashes or lesions   Spirometry:  Tracings reviewed. Her effort: Good reproducible efforts. FVC: 2.91L FEV1: 2.44L, 84% predicted FEV1/FVC ratio: 85% Interpretation: Spirometry consistent with normal pattern.  Please see scanned spirometry results for details.  Assessment:   1. Intrinsic atopic dermatitis   2. Mild persistent asthma without complication   3. Pollen-food allergy syndrome   4. Seasonal and perennial allergic rhinitis      Plan/Recommendations:  Moderate Persistent Asthma: Well-controlled Breathing test looks great - Maintenance inhaler: continue Singulair 10mg  daily.  - Rescue inhaler: Albuterol 2 puffs via spacer or 1 vial via nebulizer every 4-6 hours as needed for respiratory symptoms of cough, shortness of breath, or wheezing Asthma control goals:  Full participation in all desired activities (may need albuterol before activity) Albuterol use two times or less a week on average (not counting use with activity) Cough interfering with sleep two times or  less a month Oral steroids no more than once a year No hospitalizations   Allergic rhinitis - Previous allergy testing with [facility].  If worsening symptoms, can consider retesting.   - Avoidance  measures discussed. - Use nasal saline rinses before nose sprays such as with Neilmed Sinus Rinse.  Use distilled water.   - Use Flonase 2 sprays each nostril daily. Aim upward and outward. - Use Zyrtec 10 mg daily.  - Use Singulair 10mg  daily.  Stop if there are any mood/behavioral changes. - For eyes, use Olopatadine or Ketotifen 1 eye drop daily as needed for itchy, watery eyes.  Available over the counter, if not covered by insurance.   Atopic dermatitis - Do a daily soaking tub bath in warm water for 10-15 minutes.  - Use a gentle, unscented cleanser at the end of the bath (such as Dove unscented bar or baby wash, or Aveeno sensitive body wash). Then rinse, pat half-way dry, and apply a gentle, unscented moisturizer cream or ointment (Cerave, Cetaphil, Eucerin, Aveeno)  all over while still damp. Dry skin makes the itching and rash of eczema worse. The skin should be moisturized with a gentle, unscented moisturizer at least twice daily.  - Use only unscented liquid laundry detergent. - Apply prescribed topical steroid (triamcinolone 0.1% below neck or desonide 0.05% above neck) to flared areas (red and thickened eczema) after the moisturizer has soaked into the skin (wait at least 30 minutes). Taper off the topical steroids as the skin improves. Do not use topical steroid for more than 7-10 days at a time.  - Put Elidel onto areas of rough eczema (that is not red) twice a day. May decrease to once a day as the eczema improves. This will not thin the skin, and is safe for chronic use. Do not put this onto normal appearing skin. - Continue  Dupixent 300 mg injections every 2 weeks at home.      Oral allergy syndrome  Will get updated NUT testing today.  Avoid all nuts until it results  - These symptoms are typically not life-threatening and are because of a cross reaction between a pollen you are allergic to, and to a protein in specific foods (such as fresh fruits, vegetables, and nuts). - If  you can eat these things and tolerate the symptoms, it is fine to continue to do so.  If not, you may avoid these fresh fruits and vegetables.   - Heating these foods, buying them canned, and peeling these foods should allow them to be consumed without symptoms or with less symptoms.   Follow up in 6 months or sooner if needed.   Thank you so much for letting me partake in your care today.  Don't hesitate to reach out if you have any additional concerns!  Ferol Luz, MD  [facility] [place] [place]

## 2023-01-12 LAB — IGE NUT PROF. W/COMPONENT RFLX
F017-IgE Hazelnut (Filbert): <0.1 kU/L
F018-IgE Brazil Nut: <0.1 kU/L
F020-IgE Almond: <0.1 kU/L
F202-IgE Cashew Nut: <0.1 kU/L
F203-IgE Pistachio Nut: <0.1 kU/L
F256-IgE Walnut: <0.1 kU/L
Macadamia Nut, IgE: <0.1 kU/L
Peanut, IgE: <0.1 kU/L
Pecan Nut IgE: <0.1 kU/L

## 2023-01-19 ENCOUNTER — Telehealth: Payer: Self-pay | Admitting: Internal Medicine

## 2023-01-19 NOTE — Telephone Encounter (Signed)
Patient called to get lab results back and requested a call back.Marland Kitchen

## 2023-01-20 NOTE — Telephone Encounter (Signed)
Also patient is continuing to cough asking for advise for this issue

## 2023-01-20 NOTE — Telephone Encounter (Signed)
Response message from chrissie verbally given to patient. Patient verbally understood.

## 2023-01-20 NOTE — Telephone Encounter (Signed)
Spoke with patient regarding her coughing, which has been on going since prior to her  appointment December 9th with Dr. Marlynn Perking. Instructed patient to take her ventolin hfa 2 puffs every 4-6 hours, Dr. Marlynn Perking said she may need a preventative inhaler. she does have tessalon Perles for cough, which aren't really helping, told her to try delsym or robitussin. I  also encouraged her to do a nasal rinse a couple times a day followed by her nasal spray that will help get the nasal sputum up so it's not sitting in the pit of her throat. Lab results discussed with patient as well. Told patient she may have to come back for skin testing to nuts, depending if she is still coughing. Patient is ok to that. Patient will be seen Monday 01/23/23 at 9:30 am

## 2023-01-20 NOTE — Telephone Encounter (Signed)
Thanks! if her symptoms worsen please have her go to urgent care over the weekend. I will see her  on Monday.

## 2023-01-22 NOTE — Patient Instructions (Incomplete)
Moderate Persistent Asthma:not well controlled Spirometry today is normal,but after 4 puffs of Xopenex her FEV1 improved 21% - Maintenance inhaler:START fluticasone 110 mcg 2 puffs twice a day with a spacer. Rinse mouth out after. Demonstration given  continue Singulair 10mg  daily. Make sure and take this every day - Rescue inhaler: Albuterol 2 puffs via spacer or 1 vial via nebulizer every 4-6 hours as needed for respiratory symptoms of cough, shortness of breath, or wheezing Asthma control goals:  Full participation in all desired activities (may need albuterol before activity) Albuterol use two times or less a week on average (not counting use with activity) Cough interfering with sleep two times or less a month Oral steroids no more than once a year No hospitalizations   Allergic rhinitis- not well controlled - Previous allergy testing with [facility].  If worsening symptoms, can consider retesting.   - Avoidance measures discussed. - Use nasal saline rinses before nose sprays such as with Neilmed Sinus Rinse.  Use distilled water.   - Use Flonase 2 sprays each nostril daily. Aim upward and outward.Reviewed proper technique. - Stop Zyrtec (cetirizine) 10 mg  -Start carbinoxamine 4 mg tablet taking 1 tablet up to 3 times a day as needed for drainage down throat/runny nose. Caution as this may make you sleepy - Use Singulair 10mg  daily.  Make sure and take this every day. Stop if there are any mood/behavioral changes. - For eyes, use Olopatadine or Ketotifen 1 eye drop daily as needed for itchy, watery eyes.  Available over the counter, if not covered by insurance.  - If symptoms do not get better recommend getting retested to environmental allergens  Atopic dermatitis - Do a daily soaking tub bath in warm water for 10-15 minutes.  - Use a gentle, unscented cleanser at the end of the bath (such as Dove unscented bar or baby wash, or Aveeno sensitive body wash). Then rinse, pat half-way dry, and  apply a gentle, unscented moisturizer cream or ointment (Cerave, Cetaphil, Eucerin, Aveeno)  all over while still damp. Dry skin makes the itching and rash of eczema worse. The skin should be moisturized with a gentle, unscented moisturizer at least twice daily.  - Use only unscented liquid laundry detergent. - Apply prescribed topical steroid (triamcinolone 0.1% below neck or desonide 0.05% above neck) to flared areas (red and thickened eczema) after the moisturizer has soaked into the skin (wait at least 30 minutes). Taper off the topical steroids as the skin improves. Do not use topical steroid for more than 7-10 days at a time.  - Put Elidel onto areas of rough eczema (that is not red) twice a day. May decrease to once a day as the eczema improves. This will not thin the skin, and is safe for chronic use. Do not put this onto normal appearing skin. - Continue  Dupixent 300 mg injections every 2 weeks at home.      Oral allergy syndrome  Lab work to peanuts and tree nuts on 01/09/23 negative.   Skin testing to peanuts and tree nuts today was negative with adequate results - These symptoms are typically not life-threatening and are because of a cross reaction between a pollen you are allergic to, and to a protein in specific foods (such as fresh fruits, vegetables, and nuts). - Avoid all nuts  - Heating these foods, buying them canned, and peeling these foods should allow them to be consumed without symptoms or with less symptoms.  Let us know if you  do not get any better or develop a fever Follow up in 4 weeks or sooner if needed.

## 2023-01-23 ENCOUNTER — Ambulatory Visit (INDEPENDENT_AMBULATORY_CARE_PROVIDER_SITE_OTHER): Payer: Managed Care, Other (non HMO) | Admitting: Family

## 2023-01-23 ENCOUNTER — Encounter: Payer: Self-pay | Admitting: Family

## 2023-01-23 VITALS — BP 112/76 | HR 82 | Temp 98.6°F | Resp 16

## 2023-01-23 DIAGNOSIS — J4530 Mild persistent asthma, uncomplicated: Secondary | ICD-10-CM

## 2023-01-23 DIAGNOSIS — L2084 Intrinsic (allergic) eczema: Secondary | ICD-10-CM | POA: Diagnosis not present

## 2023-01-23 DIAGNOSIS — T781XXD Other adverse food reactions, not elsewhere classified, subsequent encounter: Secondary | ICD-10-CM

## 2023-01-23 DIAGNOSIS — J302 Other seasonal allergic rhinitis: Secondary | ICD-10-CM

## 2023-01-23 DIAGNOSIS — J3089 Other allergic rhinitis: Secondary | ICD-10-CM | POA: Diagnosis not present

## 2023-01-23 MED ORDER — CARBINOXAMINE MALEATE 4 MG PO TABS: ORAL_TABLET | ORAL | 0 refills | Status: AC

## 2023-01-23 MED ORDER — FLUTICASONE PROPIONATE HFA 110 MCG/ACT IN AERO
INHALATION_SPRAY | RESPIRATORY_TRACT | 5 refills | Status: AC
Start: 2023-01-23 — End: ?

## 2023-01-23 NOTE — Progress Notes (Signed)
[address] [place] [address] Dept: [phone]  FOLLOW UP NOTE  Patient ID: Theresa Le, female    DOB: 1990/05/13  Age: 32 y.o. MRN: [id number] Date of Office Visit: 01/23/2023  Assessment  Chief Complaint: Eczema (Backs of legs skin is still rough uses triamcinolone and Vaseline and black [place] castor oil )  HPI Theresa Le is a 32 year old female who presents today for an acute visit of cough.  She was last seen on January 09, 2023 by [facility] for atopic dermatitis, mild persistent asthma without complication, pollen food allergy syndrome, and seasonal and perennial allergic rhinitis.  She denies any new diagnosis or surgeries since her last office visit.  She reports that her last office visit on December 9th that she mentioned her ears were burning and her throat was on fire.  She was told that her throat was red and that she had some postnasal drip.  At that time her cough was minimal.  Now for the past week her cough has been worse.  She has her albuterol at work  and this did not really help the cough.  She has also tried [employer] and this did not help.  The cough is worse when she lies down at night or when she laughs.  When she is lying down she feels like she is gagging.  When asked where she feels like the cough is coming from she points to her throat.  She reports that the provider she works with at her place of employment has listened to her lungs and was told it was clear.  She is concerned because at the end of November her 27-year-old had pneumonia, but mentions that she also had a runny nose and stuffy nose.  She is not having any other symptoms.  She denies any fever, chills, or bodyaches.  She mentions though that earlier last week she felt sick and her body was drained.  She does not feel that way now.  She now just feels tired from not being able to sleep due to the cough.  Asthma: She is currently only taking Singulair as needed and has albuterol to  use as needed.  She reports cough and nocturnal awakenings due to cough.  She also reports wheezing, but when the provider at work listen to her lungs she is told that her lungs are clear.  She feels like that may be due to the rattling in her throat.  She denies tightness in chest and shortness of breath.  Since her last office visit she has not made any trips to the emergency room or urgent care due to breathing problems and has not been on any systemic steroids.  The last time she used her albuterol was last Friday at work.  Allergic rhinitis: She reports postnasal drip that she feels is sitting right there.  When she takes a Zyrtec it goes away.  She feels like the postnasal drip makes her nauseous.  She denies rhinorrhea and nasal congestion.  She has not been treated for any sinus infections since we last saw her.  She has not taken any Zyrtec over the weekend in preparation for skin testing to nuts.  Prior to that she started taking her Zyrtec more consistently when the cough got worse.  She is also only taking the Singulair as needed.  She is also not really used Flonase due to not liking stuff in her nose and not thinking that she did it right.  She  reports itchy watery eyes, but does not have any eyedrops.  She does wear contacts.  Atopic dermatitis: She reports that Dupixent has helped her eczema.  She used to have cracks and eczema all over her hands.  She will now have occasional areas that are rough.  She has not had any skin infections since we last saw her.  She uses Vaseline for moisturization.  She also has Elidel, triamcinolone 0.1% and desonide 0.05% to use as needed.  She denies any problems or reactions with her Dupixent injections.  Oral allergy syndrome: She is interested in getting skin tested to nuts.  She has been off all antihistamines for the past 3 days.  She reports in the past that her peanut scratch test was positive and she was instructed to stop eating.  In the past she had a  reaction after eating a [employer] bar.  She had never had 1 of these before.  Her gums felt tingly.  Also, later on that evening she had tea mixed with lemonade which she normally drinks and this caused her tongue to swell.  She reports that this combination of tea and lemonade usually causes her to have pins-and-needles feeling.  She took Benadryl and this helped.  She mentions if she eats [employer] that it will cause her gum to tingle.  She can also tell if restaurant has cooked [place] fries with peanut oil because it will cause tingling.   Drug Allergies:  Allergies  Allergen Reactions   Other Hives and Shortness Of Breath   Peanut-Containing Drug Products Anaphylaxis   Metronidazole Nausea Only   Prednisolone Nausea Only    Review of Systems: Negative except as per HPI   Physical Exam: BP 112/76   Pulse 82   Temp 98.6 F (37 C) (Temporal)   Resp 16   SpO2 99%    Physical Exam Constitutional:      Appearance: Normal appearance.  HENT:     Head: Normocephalic and atraumatic.     Comments: Pharynx: Drainage noted, eyes normal, ears normal, nose: Bilateral lower turbinates mildly edematous with no drainage noted    Right Ear: Tympanic membrane, ear canal and external ear normal.     Left Ear: Tympanic membrane, ear canal and external ear normal.     Mouth/Throat:     Mouth: Mucous membranes are moist.     Pharynx: Oropharynx is clear.  Eyes:     Conjunctiva/sclera: Conjunctivae normal.  Cardiovascular:     Rate and Rhythm: Regular rhythm.     Heart sounds: Normal heart sounds.  Pulmonary:     Effort: Pulmonary effort is normal.     Breath sounds: Normal breath sounds.     Comments: Lungs clear to auscultation Musculoskeletal:     Cervical back: Neck supple.  Skin:    General: Skin is warm.  Neurological:     Mental Status: She is alert and oriented to person, place, and time.  Psychiatric:        Mood and Affect: Mood normal.        Behavior: Behavior normal.         Thought Content: Thought content normal.        Judgment: Judgment normal.     Diagnostics: FVC 2.73 L (78%), FEV1 2.07 L (70%), FEV1/FVC 0.76.  Spirometry indicates normal spirometry-reduced FEV1.  4 puffs Xopenex given. FVC 2.87 L ( 82%), FEV1 2.52 L (86%), FEV1/ FVC 0.88. Spirometry indicates normal spirometry with a 21% change in  FEV1.     Assessment and Plan: 1. Seasonal and perennial allergic rhinitis   2. Not well controlled mild persistent asthma   3. Intrinsic atopic dermatitis   4. Pollen-food allergy syndrome     Meds ordered this encounter  Medications   Carbinoxamine Maleate 4 MG TABS    Sig: Take 1 tablet up to 3 times a day as needed for drainage down throat/runny nose    Dispense:  28 tablet    Refill:  0   fluticasone (FLOVENT HFA) 110 MCG/ACT inhaler    Sig: Use 2 puffs twice a day with a spacer to help prevent cough and wheeze. Rinse mouth out after to help prevent thrush    Dispense:  1 each    Refill:  5    Patient Instructions  Moderate Persistent Asthma:not well controlled Spirometry today is normal,but after 4 puffs of Xopenex her FEV1 improved 21% - Maintenance inhaler:START fluticasone 110 mcg 2 puffs twice a day with a spacer. Rinse mouth out after. Demonstration given  continue Singulair 10mg  daily. Make sure and take this every day - Rescue inhaler: Albuterol 2 puffs via spacer or 1 vial via nebulizer every 4-6 hours as needed for respiratory symptoms of cough, shortness of breath, or wheezing Asthma control goals:  Full participation in all desired activities (may need albuterol before activity) Albuterol use two times or less a week on average (not counting use with activity) Cough interfering with sleep two times or less a month Oral steroids no more than once a year No hospitalizations   Allergic rhinitis- not well controlled - Previous allergy testing with [facility].  If worsening symptoms, can consider retesting.   - Avoidance measures  discussed. - Use nasal saline rinses before nose sprays such as with Neilmed Sinus Rinse.  Use distilled water.   - Use Flonase 2 sprays each nostril daily. Aim upward and outward.Reviewed proper technique. - Stop Zyrtec (cetirizine) 10 mg  -Start carbinoxamine 4 mg tablet taking 1 tablet up to 3 times a day as needed for drainage down throat/runny nose. Caution as this may make you sleepy - Use Singulair 10mg  daily.  Make sure and take this every day. Stop if there are any mood/behavioral changes. - For eyes, use Olopatadine or Ketotifen 1 eye drop daily as needed for itchy, watery eyes.  Available over the counter, if not covered by insurance.  - If symptoms do not get better recommend getting retested to environmental allergens  Atopic dermatitis - Do a daily soaking tub bath in warm water for 10-15 minutes.  - Use a gentle, unscented cleanser at the end of the bath (such as Dove unscented bar or baby wash, or Aveeno sensitive body wash). Then rinse, pat half-way dry, and apply a gentle, unscented moisturizer cream or ointment (Cerave, Cetaphil, Eucerin, Aveeno)  all over while still damp. Dry skin makes the itching and rash of eczema worse. The skin should be moisturized with a gentle, unscented moisturizer at least twice daily.  - Use only unscented liquid laundry detergent. - Apply prescribed topical steroid (triamcinolone 0.1% below neck or desonide 0.05% above neck) to flared areas (red and thickened eczema) after the moisturizer has soaked into the skin (wait at least 30 minutes). Taper off the topical steroids as the skin improves. Do not use topical steroid for more than 7-10 days at a time.  - Put Elidel onto areas of rough eczema (that is not red) twice a day. May decrease to once a day  as the eczema improves. This will not thin the skin, and is safe for chronic use. Do not put this onto normal appearing skin. - Continue  Dupixent 300 mg injections every 2 weeks at home.      Oral  allergy syndrome  Lab work to peanuts and tree nuts on 01/09/23 negative.   Skin testing to peanuts and tree nuts today was negative with adequate results - These symptoms are typically not life-threatening and are because of a cross reaction between a pollen you are allergic to, and to a protein in specific foods (such as fresh fruits, vegetables, and nuts). - Avoid all nuts  - Heating these foods, buying them canned, and peeling these foods should allow them to be consumed without symptoms or with less symptoms.  Let us know if you do not get any better or develop a fever Follow up in 4 weeks or sooner if needed.  Return in about 4 weeks (around 02/20/2023), or if symptoms worsen or fail to improve.    Thank you for the opportunity to care for this patient.  Please do not hesitate to contact me with questions.  Nehemiah Settle, FNP [facility]

## 2023-01-23 NOTE — Addendum Note (Signed)
Addended by: Berna Bue on: 01/23/2023 04:29 PM   Modules accepted: Orders

## 2023-01-24 ENCOUNTER — Other Ambulatory Visit (HOSPITAL_COMMUNITY): Payer: Self-pay

## 2023-02-20 ENCOUNTER — Ambulatory Visit: Payer: Managed Care, Other (non HMO) | Admitting: Family

## 2023-07-10 ENCOUNTER — Ambulatory Visit: Payer: Managed Care, Other (non HMO) | Admitting: Internal Medicine

## 2023-08-11 ENCOUNTER — Other Ambulatory Visit: Payer: Self-pay | Admitting: Internal Medicine
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
## 2024-02-22 ENCOUNTER — Ambulatory Visit: Admitting: Internal Medicine

## 2024-02-22 DIAGNOSIS — J302 Other seasonal allergic rhinitis: Secondary | ICD-10-CM

## 2024-03-19 ENCOUNTER — Ambulatory Visit: Admitting: Internal Medicine
